# Patient Record
Sex: Female | Born: 1935 | Race: White | Hispanic: No
Health system: Southern US, Community
[De-identification: ages and names within clinical notes are randomized; demographics above are authoritative.]

## PROBLEM LIST (undated history)

## (undated) DIAGNOSIS — R609 Edema, unspecified: Secondary | ICD-10-CM

## (undated) DIAGNOSIS — I499 Cardiac arrhythmia, unspecified: Secondary | ICD-10-CM

## (undated) DIAGNOSIS — N189 Chronic kidney disease, unspecified: Secondary | ICD-10-CM

## (undated) DIAGNOSIS — M1990 Unspecified osteoarthritis, unspecified site: Secondary | ICD-10-CM

## (undated) DIAGNOSIS — D649 Anemia, unspecified: Secondary | ICD-10-CM

## (undated) DIAGNOSIS — C801 Malignant (primary) neoplasm, unspecified: Secondary | ICD-10-CM

## (undated) DIAGNOSIS — G20 Parkinson's disease: Secondary | ICD-10-CM

## (undated) DIAGNOSIS — F0390 Unspecified dementia without behavioral disturbance: Secondary | ICD-10-CM

## (undated) DIAGNOSIS — H9190 Unspecified hearing loss, unspecified ear: Secondary | ICD-10-CM

## (undated) DIAGNOSIS — K219 Gastro-esophageal reflux disease without esophagitis: Secondary | ICD-10-CM

## (undated) DIAGNOSIS — R002 Palpitations: Secondary | ICD-10-CM

## (undated) DIAGNOSIS — I471 Supraventricular tachycardia: Secondary | ICD-10-CM

## (undated) DIAGNOSIS — E039 Hypothyroidism, unspecified: Secondary | ICD-10-CM

## (undated) HISTORY — PX: OTHER SURGICAL HISTORY: SHX169

## (undated) HISTORY — DX: Supraventricular tachycardia: I47.1

## (undated) HISTORY — PX: JOINT REPLACEMENT: SHX530

## (undated) HISTORY — DX: Palpitations: R00.2

## (undated) HISTORY — DX: Edema, unspecified: R60.9

## (undated) HISTORY — PX: ABDOMINAL HYSTERECTOMY: SHX81

## (undated) HISTORY — PX: CARDIAC ELECTROPHYSIOLOGY STUDY AND ABLATION: SHX1294

---

## 2004-02-13 ENCOUNTER — Other Ambulatory Visit: Payer: Self-pay

## 2005-01-11 ENCOUNTER — Ambulatory Visit: Payer: Self-pay | Admitting: Internal Medicine

## 2005-01-29 ENCOUNTER — Ambulatory Visit: Payer: Self-pay | Admitting: Unknown Physician Specialty

## 2005-10-14 ENCOUNTER — Ambulatory Visit: Payer: Self-pay | Admitting: Gastroenterology

## 2005-12-06 ENCOUNTER — Ambulatory Visit: Payer: Self-pay | Admitting: Gastroenterology

## 2005-12-19 ENCOUNTER — Ambulatory Visit: Payer: Self-pay | Admitting: Gastroenterology

## 2006-01-30 ENCOUNTER — Ambulatory Visit: Payer: Self-pay | Admitting: Unknown Physician Specialty

## 2006-02-05 ENCOUNTER — Ambulatory Visit: Payer: Self-pay | Admitting: Internal Medicine

## 2007-02-10 ENCOUNTER — Ambulatory Visit: Payer: Self-pay | Admitting: Unknown Physician Specialty

## 2007-03-11 ENCOUNTER — Other Ambulatory Visit: Payer: Self-pay

## 2007-03-11 ENCOUNTER — Emergency Department: Payer: Self-pay | Admitting: Unknown Physician Specialty

## 2007-04-08 ENCOUNTER — Ambulatory Visit: Payer: Self-pay | Admitting: Orthopaedic Surgery

## 2007-05-25 ENCOUNTER — Ambulatory Visit: Payer: Self-pay | Admitting: Internal Medicine

## 2007-06-12 ENCOUNTER — Ambulatory Visit: Payer: Self-pay | Admitting: Internal Medicine

## 2007-06-12 ENCOUNTER — Ambulatory Visit (HOSPITAL_COMMUNITY): Admission: RE | Admit: 2007-06-12 | Discharge: 2007-06-13 | Payer: Self-pay | Admitting: Internal Medicine

## 2007-07-06 ENCOUNTER — Ambulatory Visit: Payer: Self-pay | Admitting: Internal Medicine

## 2007-12-27 ENCOUNTER — Ambulatory Visit: Payer: Self-pay | Admitting: Internal Medicine

## 2008-01-15 ENCOUNTER — Ambulatory Visit: Payer: Self-pay | Admitting: Internal Medicine

## 2008-01-24 ENCOUNTER — Ambulatory Visit: Payer: Self-pay | Admitting: Internal Medicine

## 2008-09-15 ENCOUNTER — Ambulatory Visit: Payer: Self-pay | Admitting: Unknown Physician Specialty

## 2009-05-02 DIAGNOSIS — I471 Supraventricular tachycardia, unspecified: Secondary | ICD-10-CM | POA: Insufficient documentation

## 2009-05-02 DIAGNOSIS — R002 Palpitations: Secondary | ICD-10-CM

## 2009-05-02 DIAGNOSIS — R609 Edema, unspecified: Secondary | ICD-10-CM

## 2009-09-19 ENCOUNTER — Ambulatory Visit: Payer: Self-pay | Admitting: Unknown Physician Specialty

## 2010-09-21 ENCOUNTER — Ambulatory Visit: Payer: Self-pay | Admitting: Unknown Physician Specialty

## 2011-04-09 NOTE — Op Note (Signed)
NAMELEONORA, GORES                 ACCOUNT NO.:  [id number]   MEDICAL RECORD NO.:  [id number]          PATIENT TYPE:  OIB   LOCATION:  4731                         FACILITY:  [facility]   PHYSICIAN:  Duke Salvia, MD, FACCDATE OF BIRTH:  [birth year]   DATE OF PROCEDURE:  06/12/2007  DATE OF DISCHARGE:  06/13/2007                               OPERATIVE REPORT   PREOPERATIVE DIAGNOSIS:  Supraventricular tachycardia.   POSTOPERATIVE DIAGNOSIS:  AV nodal re-entry tachycardia.   PROCEDURE:  Invasive electrophysiological study, arrhythmia mapping, and  radiofrequency catheter ablation.   Following obtaining informed consent, the patient was brought to the  electrophysiology laboratory and placed on the fluoroscopic table in the  supine position.  After routine prep and drape, cardiac catheterization  was performed and local anesthesia with conscious sedation.  Noninvasive  blood pressure monitoring and transcutaneous oxygen saturation  monitoring were performed continuously throughout the procedure.  Following the procedure, the catheters were removed, hemostasis was  obtained, and the patient was transferred to the holding area in stable  condition.   CATHETERS:  5-French quadripolar catheter was inserted via the left  femoral vein to the right ventricular apex.  5-French quadripolar catheter was inserted via the left femoral vein to  the AV junction.  6-French octapolar catheter was inserted via the right femoral vein to  the coronary sinus.  7-French 4-mm deflectable ablation tip catheter was inserted via a  Swartz SL2 sheath via the right femoral vein to mapping sites in the  posterior septal space.   SURFACES:  1, aVF and V1 were monitored continuously throughout the  procedure.  Following insertion of the catheters, the stimulation  protocol included incremental atrial pacing.  Incremental ventricular pacing.  Single atrial extra stimuli at paced cycle length of 600 and 450  milliseconds.   RESULTS:  Surface electrocardiogram:   Initial:  Rhythm:  Sinus; RR interval 881 milliseconds; PR interval 206  milliseconds; QRS duration 360 milliseconds (recorded but probably too  short) QT interval:  403 milliseconds; P-wave duration 112 milliseconds;  bundle branch block:  Absent; pre-excitation:  Absent.  AH interval is 112 milliseconds; HV interval 49 milliseconds.   Final:  Rhythm:  Sinus; RR interval 707 milliseconds; PR interval:  170  milliseconds; QRS duration 83 milliseconds; QT interval 346  milliseconds; pre-excitation:  Absent; bundle branch block:  Absent.  AH interval:  90 milliseconds; HV interval 46 milliseconds.   AV nodal function:  AV Wenckebach at 40 milliseconds.  VA Wenckebach at 360 milliseconds.  AV nodal effective refractory period with pathway the AV node 600  milliseconds was 480 milliseconds.  AV nodal conduction was discontinuous echo beats pre ablation and was  continuous post ablation.  Retrograde conduction was continuous pre and post ablation.   ACCESSORY PATHWAY FUNCTION:  No evidence of na accessory pathway was  identified.   ARRHYTHMIAS INDUCED.:  AV nodal reentry tachycardia was reproducibly  induced with double atrial extra stimuli at 450:  370:  290.  Tachycardia cycle length was approximately 495 milliseconds.   In a typical episode of tachycardia, the AH interval  was 337  milliseconds, the VA time was 44 milliseconds, and the HA time was 56  milliseconds.   The tachycardia was AH prolongation dependent, atrial activation was in  His electrogram.   RADIOFREQUENCY ENERGY:  A total of 39 seconds of radio wave energy was  applied at sites of presumed slow pathway potentials.  Junctional  tachycardia ensued and thereafter no evidence of AV nodal slow pathway  antegrade conduction was seen.   FLUOROSCOPY TIME:  A total of 4 minutes and 48 seconds of fluoroscopy  time was utilized at 7.55 frames per second.    IMPRESSION:  1. Normal sinus function.  2. Normal atrial function.  3. Abnormal AV node function manifested by inducible slow-fast typical      AV nodal reentry tachycardia.  Slow pathway modification using      radiofrequency energy successfully eliminated the substrate from      the patient's arrhythmia mechanism.  4. Normal His-Purkinje system function.  5. No accessory pathway.  6. Normal ventricular response to programmed stimulation.   SUMMARY OF CONCLUSION:  The results of electrophysiological testing  confirmed AV nodal reentry tachycardia mechanism underlying the  patient's clinical arrhythmia.  Slow pathway modification successfully  modified the substrate and rendered the patient non-inducible.   I plan to watch the patient overnight, endocarditis and aspirin  prophylaxis for 6-12 weeks.      Duke Salvia, MD, [facility]  Electronically Signed     SCK/MEDQ  D:  07/28/2007  T:  07/28/2007  Job:  [id number]

## 2011-04-09 NOTE — Discharge Summary (Signed)
NAME:  RIELYNN, TRULSON                 ACCOUNT NO.:  [id number]   MEDICAL RECORD NO.:  [id number]          PATIENT TYPE:  OIB   LOCATION:  4731                         FACILITY:  [facility]   PHYSICIAN:  Duke Salvia, MD, FACCDATE OF BIRTH:  1924-06-01   DATE OF ADMISSION:  06/12/2007  DATE OF DISCHARGE:                               DISCHARGE SUMMARY   This patient has no known drug allergies.   Dictation and examination greater than 35 minutes.   FINAL DIAGNOSES:  1. Recurrent supraventricular tachycardia with syncope, presyncope and      marked palpitation.      a.     Electrophysiology study July 18, radiofrequency catheter       ablation with slow P-wave modification.   SECONDARY DIAGNOSES:  1. Treated hypothyroidism.  2. Probable diastolic congestive heart failure/dyspnea on exertion.  3. Gastroesophageal reflux disease.  4. Hypertension.  5. Status post total knee arthroplasty.   PROCEDURE:  June 12, 2007, electrophysiology study, radiofrequency  catheter ablation of supraventricular tachycardia with slow P-wave  modification, Dr. Sherryl Manges.   BRIEF HISTORY:  Ms. Wonders is an 77 year old female.  She has a history  of recurrent palpitations.  These correlate with SVT.  She often gets  dizzy when she is experiencing that rapid heart rates.  Sometimes they  last all day, sometimes only 15 minutes, but in the last 3 months she  has had to go to the hospital three times in order to stop them.  She  has a beta blocker and she takes rescue dose of beta blocker, and  sometimes this does not help.  Her past medical cardiac workup includes  an echocardiogram, a normal Myoview, remote catheterizations, normal.  Dr. Graciela Husbands prior to doing this procedure recommended a Myoview study with  Dr. Lady Gary.  This study is not available.  Dr. Graciela Husbands discussed the many  options to treating SVT including medical therapy, which consists mainly  of AV node modification drugs or antiarrhythmic drugs  or ablation.  The  patient opts for ablation and will present electively.   HOSPITAL COURSE:  The patient presented July 18.  She underwent  electrophysiology study with radiofrequency catheter ablation,  successful slow P wave modification, Dr. Graciela Husbands.  The patient has had no  postprocedural complications and discharges after overnight observation  on telemetry.  The patient has maintained sinus rhythm, has no  recurrence of tachyarrhythmias.   Lab work pertinent to this admission was drawn July 14.  The glucose was  103, BUN is 47, creatinine 1.55, the sodium was 142, potassium 4.2,  chloride 105, and carbonate 24.  White cells 6.1 hemoglobin 11.5,  hematocrit 33.5, platelets of 184.  Pro time is 10.7, INR is 1.0.  PTT  is 29.      Maple Mirza, [place]      Duke Salvia, MD, [facility]  Electronically Signed    GM/MEDQ  D:  06/12/2007  T:  06/13/2007  Job:  [id number]   cc:   Jenkins Rouge, MD  Dr. Lady Gary

## 2011-04-09 NOTE — Discharge Summary (Signed)
Dana Hudson, Dana Hudson                 ACCOUNT NO.:  [id number]   MEDICAL RECORD NO.:  [id number]          PATIENT TYPE:  OIB   LOCATION:  4731                         FACILITY:  [facility]   PHYSICIAN:  Joellyn Rued, PA-C     DATE OF BIRTH:  1924-06-13   DATE OF ADMISSION:  06/12/2007  DATE OF DISCHARGE:                         DISCHARGE SUMMARY - REFERRING   NO DICTATION      Joellyn Rued, PA-C     EW/MEDQ  D:  06/13/2007  T:  06/13/2007  Job:  [id number]

## 2011-04-09 NOTE — Discharge Summary (Signed)
NAME:  Dana Hudson, Dana Hudson                 ACCOUNT NO.:  [id number]   MEDICAL RECORD NO.:  [id number]          PATIENT TYPE:  OIB   LOCATION:  4731                         FACILITY:  [facility]   PHYSICIAN:  Joellyn Rued, PA-C     DATE OF BIRTH:  May 09, 1924   DATE OF ADMISSION:  06/12/2007  DATE OF DISCHARGE:                         DISCHARGE SUMMARY - REFERRING   ADDENDUM:  Prior to discharge, Dr. Jens Som did a quick look  echocardiogram, that according to Dr. Ludwig Clarks verbal report was  unremarkable.  He also ordered a PA and lateral chest x-ray which showed  a question nodule.  This is repeated in a bleak position.  Did not show  any further nodule, thus, she was discharged home.      Joellyn Rued, PA-C     EW/MEDQ  D:  06/13/2007  T:  06/13/2007  Job:  [id number]

## 2011-04-09 NOTE — Letter (Signed)
July 06, 2007    Cheryl L. Lin Givens, M.D.  [address] [place], [place] [address]   RE:  MORENE, CECILIO  MRN:  [id number]  /  DOB:  1924-11-18   Dear Elnita Maxwell:   Ms. Faiella comes in following her catheter ablation for AV nodal re-  entry tachycardia.  She has had no recurrent tachy palpitations.  She  does have thumps which are likely representative of PVCs or PACs.  She  is otherwise doing pretty well.  She has had significant lower extremity  edema, which has been progressive since her procedure at which time her  Lasix had been discontinued.   CURRENT MEDICATIONS:  1. Diovan 80/12.5.  2. Levothyroxine 88.  3. Toprol 25.  4. Omeprazole.   PHYSICAL EXAMINATION:  VITAL SIGNS:  Her blood pressure is 118/72.  Her  pulse is 66.  Her weight was 201, which is down 5 pounds from the end of  June.  LUNGS:  Clear.  HEART:  Sounds were regular.  EXTREMITIES:  Had significant bilateral peripheral edema.   Electrocardiogram was not obtained today but her pulse was normal.   IMPRESSION:  1. Supraventricular tachycardia, status post slow pathway      modification.  2. Palpitations consistent with premature ventricular      contractions/premature atrial contractions.  3. Peripheral edema.   I have suggested that she resume her Lasix taking 20 mg every other day  until she sees you which is a month from now.  She will continue on her  Diovan HCT.   Thanks very much for allowing me to participate in her care.    Sincerely,      Duke Salvia, MD, [facility]  Electronically Signed    SCK/MedQ  DD: 07/06/2007  DT: 07/07/2007  Job #: [id number]

## 2011-04-09 NOTE — Letter (Signed)
May 25, 2007    Dana Hudson, M.D.  [address]  [place] [address]   RE:  Dana, Hudson  MRN:  [id number]  /  DOB:  [birth year]   Dear Dana Hudson,   It was a pleasure to see Dana Hudson again at your request because of  recurrent supraventricular Hudson.   As you remember, I saw her about two or three years ago for you and Dr.  Harold Hedge because of recurrent symptoms.  These have been going on  for 20 years or so.  They were abrupt in onset and offset, and on one  occasion they have been associated with syncope.  They are frequently  associated with presyncope.  They are FROG positive.  They are diuretic  negative.  She has had to go to the hospital three times in the last  three months for these.   She has also had problems with progressive shortness of breath over the  last six months.  When I had seen her in February 2006, data prior to  that included a normal echocardiogram and a normal Myoview and a remote  catheterization that was also normal.  She does have a history of  hypertension.  She has some peripheral edema.   PAST MEDICAL HISTORY:  1. GE reflux disease.  2. Treated hypothyroidism.  3. A propensity toward bradycardia.   REVIEW OF SYSTEMS:  In addition to the above, is notable for arthritis.   PAST SURGICAL HISTORY:  Knee replacement surgery.   SOCIAL HISTORY:  She is retired.  She is widowed.  She has four children  and four grandchildren.  She comes in today with one of her  granddaughters.  She has a number of great-grandchildren.   She does not use cigarettes, alcohol, or recreational drugs.   MEDICATIONS TODAY:  1. Levothyroxine 88 mcg.  2. Toprol 50.  3. Diovan 80/12.5.  4. Prilosec 40.   She has no known drug allergies.   PHYSICAL EXAMINATION:  VITAL SIGNS:  Her blood pressure is 118/72, her  pulse is 66.  NECK:  Her neck veins were 7 or 8.  Her carotids were brisk  BACK: Without kyphosis or scoliosis.  LUNGS:   She did have some basilar crackles.  HEART SOUNDS:  Regular, without murmurs or gallops.  ABDOMEN:  Soft, with active bowel sounds.  EXTREMITIES:  Femoral pulses were 2+.  Distal pulses were intact.  There  is 1+ edema on the right, 1-2+ edema on the left, with moderate venous  stasis changes on the left.  NEUROLOGIC:  Grossly normal.   Electrocardiogram from the hospital the other day demonstrated a narrow  QRS Hudson at a cycle length of 500 msec.  There was an R prime  inscribed on the very proximal portion of the ST segment in lead V1.   IMPRESSION:  1. Recurrent supraventricular Hudson, probable atrioventricular      nodal reentry.  2. Hypertension.  3. Dyspnea on exertion, probably diastolic heart failure, probably      chronic.  4. Gastroesophageal reflux disease.   Dana Hudson, Dana Hudson.  She and  her granddaughter and I discussed at length the treatment options,  including augmented beta blocker and/or calcium blocker therapy,  realizing that the bradycardia might be a problem, as well as catheter  ablation with EP testing.  We also discussed antiarrhythmic drugs and  the potential for proarrhythmia.  After reviewing the above and trying  to  understand that her symptoms are really quite limiting in terms of a)  frequency, b) severity, and c) the associated fear factor, she has  decided to proceed with catheter ablation.  We have reviewed the  potential benefits as well as the potential risks, including but not  limited to death, perforation, and heart block requiring pacemaker  implantation.   I am also concerned, however, about the dyspnea.  I suspect that is  chronic diastolic failure.  I have given her a prescription today for  Lasix 40 every other day to take in addition to her Diovan/HCT.  I have  also arranged for her to have a Myoview scan with Dr. Lady Gary to make sure  there is no intercurrent deterioration in her  systolic function or  identified ischemia which may be contributing to her dyspnea.   Dana Hudson, thanks very much for asking [place] to see her.    Sincerely,      Duke Salvia, MD, [facility]  Electronically Signed    SCK/MedQ  DD: 05/25/2007  DT: 05/25/2007  Job #: [id number]   CC:    Harold Hedge, M.D.

## 2011-05-16 ENCOUNTER — Encounter: Payer: Self-pay | Admitting: Cardiovascular Disease

## 2012-08-04 ENCOUNTER — Ambulatory Visit: Payer: Self-pay

## 2017-10-09 ENCOUNTER — Encounter: Payer: Self-pay | Admitting: *Deleted

## 2017-10-14 ENCOUNTER — Encounter: Payer: Self-pay | Admitting: *Deleted

## 2017-10-14 ENCOUNTER — Encounter
Admission: RE | Disposition: A | Discharge status: Home / Self Care | Payer: Self-pay | Source: Ambulatory Visit | Attending: Ophthalmology

## 2017-10-14 ENCOUNTER — Ambulatory Visit: Payer: Medicare (Managed Care) | Admitting: Anesthesiology

## 2017-10-14 ENCOUNTER — Ambulatory Visit
Admission: RE | Admit: 2017-10-14 | Discharge: 2017-10-14 | Disposition: A | Discharge status: Home / Self Care | Payer: Medicare (Managed Care) | Source: Ambulatory Visit | Attending: Ophthalmology | Admitting: Ophthalmology

## 2017-10-14 ENCOUNTER — Other Ambulatory Visit: Payer: Self-pay

## 2017-10-14 DIAGNOSIS — F0390 Unspecified dementia without behavioral disturbance: Secondary | ICD-10-CM | POA: Diagnosis not present

## 2017-10-14 DIAGNOSIS — Z90710 Acquired absence of both cervix and uterus: Secondary | ICD-10-CM | POA: Insufficient documentation

## 2017-10-14 DIAGNOSIS — E039 Hypothyroidism, unspecified: Secondary | ICD-10-CM | POA: Insufficient documentation

## 2017-10-14 DIAGNOSIS — Z79899 Other long term (current) drug therapy: Secondary | ICD-10-CM | POA: Insufficient documentation

## 2017-10-14 DIAGNOSIS — N183 Chronic kidney disease, stage 3 (moderate): Secondary | ICD-10-CM | POA: Insufficient documentation

## 2017-10-14 DIAGNOSIS — M1990 Unspecified osteoarthritis, unspecified site: Secondary | ICD-10-CM | POA: Diagnosis not present

## 2017-10-14 DIAGNOSIS — G20 Parkinson's disease: Secondary | ICD-10-CM | POA: Insufficient documentation

## 2017-10-14 DIAGNOSIS — K219 Gastro-esophageal reflux disease without esophagitis: Secondary | ICD-10-CM | POA: Insufficient documentation

## 2017-10-14 DIAGNOSIS — Z85828 Personal history of other malignant neoplasm of skin: Secondary | ICD-10-CM | POA: Diagnosis not present

## 2017-10-14 DIAGNOSIS — H2511 Age-related nuclear cataract, right eye: Secondary | ICD-10-CM | POA: Insufficient documentation

## 2017-10-14 HISTORY — DX: Malignant (primary) neoplasm, unspecified: C80.1

## 2017-10-14 HISTORY — DX: Cardiac arrhythmia, unspecified: I49.9

## 2017-10-14 HISTORY — DX: Chronic kidney disease, unspecified: N18.9

## 2017-10-14 HISTORY — DX: Parkinson's disease: G20

## 2017-10-14 HISTORY — DX: Gastro-esophageal reflux disease without esophagitis: K21.9

## 2017-10-14 HISTORY — DX: Unspecified dementia, unspecified severity, without behavioral disturbance, psychotic disturbance, mood disturbance, and anxiety: F03.90

## 2017-10-14 HISTORY — DX: Hypothyroidism, unspecified: E03.9

## 2017-10-14 HISTORY — DX: Anemia, unspecified: D64.9

## 2017-10-14 HISTORY — PX: CATARACT EXTRACTION W/PHACO: SHX586

## 2017-10-14 SURGERY — PHACOEMULSIFICATION, CATARACT, WITH IOL INSERTION
Anesthesia: Monitor Anesthesia Care | Site: Eye | Laterality: Right | Wound class: Clean

## 2017-10-14 MED ORDER — LABETALOL HCL 5 MG/ML IV SOLN
INTRAVENOUS | Status: AC
  Filled 2017-10-14: qty 4

## 2017-10-14 MED ORDER — POVIDONE-IODINE 5 % OP SOLN
OPHTHALMIC | Status: DC | PRN
  Administered 2017-10-14: 1 via OPHTHALMIC

## 2017-10-14 MED ORDER — MIDAZOLAM HCL 2 MG/2ML IJ SOLN
INTRAMUSCULAR | Status: AC
  Filled 2017-10-14: qty 2

## 2017-10-14 MED ORDER — MIDAZOLAM HCL 2 MG/2ML IJ SOLN
INTRAMUSCULAR | Status: DC | PRN
  Administered 2017-10-14: .5 mg via INTRAVENOUS

## 2017-10-14 MED ORDER — MOXIFLOXACIN HCL 0.5 % OP SOLN: 1.0000 [drp] | OPHTHALMIC | Status: DC | PRN

## 2017-10-14 MED ORDER — GLYCOPYRROLATE 0.2 MG/ML IJ SOLN
INTRAMUSCULAR | Status: AC
  Filled 2017-10-14: qty 1

## 2017-10-14 MED ORDER — CARBACHOL 0.01 % IO SOLN
INTRAOCULAR | Status: DC | PRN
  Administered 2017-10-14: .5 mL via INTRAOCULAR

## 2017-10-14 MED ORDER — ARMC OPHTHALMIC DILATING DROPS
1.0000 "application " | OPHTHALMIC | Status: DC
  Administered 2017-10-14 (×3): 1 via OPHTHALMIC

## 2017-10-14 MED ORDER — MOXIFLOXACIN HCL 0.5 % OP SOLN
OPHTHALMIC | Status: AC
  Filled 2017-10-14: qty 3

## 2017-10-14 MED ORDER — SODIUM CHLORIDE 0.9 % IV SOLN
INTRAVENOUS | Status: DC
  Administered 2017-10-14: 10:00:00 via INTRAVENOUS

## 2017-10-14 MED ORDER — EPINEPHRINE PF 1 MG/ML IJ SOLN
INTRAMUSCULAR | Status: DC | PRN
  Administered 2017-10-14: 1 mL via OPHTHALMIC

## 2017-10-14 MED ORDER — NA CHONDROIT SULF-NA HYALURON 40-17 MG/ML IO SOLN
INTRAOCULAR | Status: DC | PRN
  Administered 2017-10-14: 1 mL via INTRAOCULAR

## 2017-10-14 MED ORDER — MOXIFLOXACIN HCL 0.5 % OP SOLN
OPHTHALMIC | Status: DC | PRN
Start: 2017-10-14 — End: 2017-10-14
  Administered 2017-10-14: .2 mL via OPHTHALMIC

## 2017-10-14 MED ORDER — ARMC OPHTHALMIC DILATING DROPS
OPHTHALMIC | Status: AC
  Filled 2017-10-14: qty 0.4

## 2017-10-14 MED ORDER — GLYCOPYRROLATE 0.2 MG/ML IJ SOLN
INTRAMUSCULAR | Status: DC | PRN
  Administered 2017-10-14: 0.2 mg via INTRAVENOUS

## 2017-10-14 MED ORDER — LIDOCAINE HCL (PF) 4 % IJ SOLN
INTRAMUSCULAR | Status: DC | PRN
  Administered 2017-10-14: 2 mL via OPHTHALMIC

## 2017-10-14 SURGICAL SUPPLY — 16 items
GLOVE BIO SURGEON STRL SZ8 (GLOVE) ×3 IMPLANT
GLOVE BIOGEL M 6.5 STRL (GLOVE) ×3 IMPLANT
GLOVE SURG LX 8.0 MICRO (GLOVE) ×2
GLOVE SURG LX STRL 8.0 MICRO (GLOVE) ×1 IMPLANT
GOWN STRL REUS W/ TWL LRG LVL3 (GOWN DISPOSABLE) ×2 IMPLANT
GOWN STRL REUS W/TWL LRG LVL3 (GOWN DISPOSABLE) ×6
LABEL CATARACT MEDS ST (LABEL) ×3 IMPLANT
LENS IOL TECNIS ITEC 22.5 (Intraocular Lens) ×2 IMPLANT
PACK CATARACT (MISCELLANEOUS) ×3 IMPLANT
PACK CATARACT BRASINGTON LX (MISCELLANEOUS) ×3 IMPLANT
PACK EYE AFTER SURG (MISCELLANEOUS) ×3 IMPLANT
SOL BSS BAG (MISCELLANEOUS) ×3
SOLUTION BSS BAG (MISCELLANEOUS) ×1 IMPLANT
SYR 5ML LL (SYRINGE) ×3 IMPLANT
WATER STERILE IRR 250ML POUR (IV SOLUTION) ×3 IMPLANT
WIPE NON LINTING 3.25X3.25 (MISCELLANEOUS) ×3 IMPLANT

## 2017-10-14 NOTE — Op Note (Signed)
PREOPERATIVE DIAGNOSIS:  Nuclear sclerotic cataract of the right eye.   POSTOPERATIVE DIAGNOSIS:  nuclear sclerotic cataract right eye   OPERATIVE PROCEDURE: Procedure(s): CATARACT EXTRACTION PHACO AND INTRAOCULAR LENS PLACEMENT (IOC)   SURGEON:  Birder Robson, MD.   ANESTHESIA:  Anesthesiologist: Emmie Niemann, MD CRNA: Jonna Clark, CRNA  1.      Managed anesthesia care. 2.      0.35ml of Shugarcaine was instilled in the eye following the paracentesis.   COMPLICATIONS:  None.   TECHNIQUE:   Stop and chop   DESCRIPTION OF PROCEDURE:  The patient was examined and consented in the preoperative holding area where the aforementioned topical anesthesia was applied to the right eye and then brought back to the Operating Room where the right eye was prepped and draped in the usual sterile ophthalmic fashion and a lid speculum was placed. A paracentesis was created with the side port blade and the anterior chamber was filled with viscoelastic. A near clear corneal incision was performed with the steel keratome. A continuous curvilinear capsulorrhexis was performed with a cystotome followed by the capsulorrhexis forceps. Hydrodissection and hydrodelineation were carried out with BSS on a blunt cannula. The lens was removed in a stop and chop  technique and the remaining cortical material was removed with the irrigation-aspiration handpiece. The capsular bag was inflated with viscoelastic and the Technis [id number]  lens was placed in the capsular bag without complication. The remaining viscoelastic was removed from the eye with the irrigation-aspiration handpiece. The wounds were hydrated. The anterior chamber was flushed with Miostat and the eye was inflated to physiologic pressure. 0.36ml of Vigamox was placed in the anterior chamber. The wounds were found to be water tight. The eye was dressed with Vigamox. The patient was given protective glasses to wear throughout the day and a shield with which  to sleep tonight. The patient was also given drops with which to begin a drop regimen today and will follow-up with me in one day. Implant Name Type Inv. Item Serial No. Manufacturer Lot No. LRB No. Used  LENS IOL DIOP 22.5 - [id number] Intraocular Lens LENS IOL DIOP 22.5 [id number] AMO  Right 1   Procedure(s) with comments: CATARACT EXTRACTION PHACO AND INTRAOCULAR LENS PLACEMENT (IOC) (Right) - [place] 01:04 AP% 16.9 CDE 10.96 Fluid pack lot # [id number]  Electronically signed: [place] 10/14/2017 11:35 AM

## 2017-10-14 NOTE — Anesthesia Postprocedure Evaluation (Signed)
Anesthesia Post Note  Patient: Dana Hudson  Procedure(s) Performed: CATARACT EXTRACTION PHACO AND INTRAOCULAR LENS PLACEMENT ([place]) (Right Eye)  Patient location during evaluation: Short Stay Anesthesia Type: MAC Level of consciousness: awake and alert and oriented Pain management: pain level controlled Vital Signs Assessment: post-procedure vital signs reviewed and stable Respiratory status: spontaneous breathing Cardiovascular status: stable Postop Assessment: no headache, no apparent nausea or vomiting and adequate PO intake Anesthetic complications: no     Last Vitals:  Vitals:   10/14/17 1138 10/14/17 1152  BP: (!) 152/70 (!) 163/63  Pulse: 62 75  Resp: 16   Temp:    SpO2: 97% 97%    Last Pain:  Vitals:   10/14/17 1007  TempSrc: Temporal                 Lanora Manis

## 2017-10-14 NOTE — H&P (Signed)
All labs reviewed. Abnormal studies sent to patients PCP when indicated.  Previous H&P reviewed, patient examined, there are NO CHANGES.  Dana Hudson LOUIS11/20/201811:08 AM

## 2017-10-14 NOTE — Discharge Instructions (Signed)
Eye Surgery Discharge Instructions  Expect mild scratchy sensation or mild soreness. DO NOT RUB YOUR EYE!  The day of surgery:  Minimal physical activity, but bed rest is not required  No reading, computer work, or close hand work  No bending, lifting, or straining.  May watch TV  For 24 hours:  No driving, legal decisions, or alcoholic beverages  Safety precautions  Eat anything you prefer: It is better to start with liquids, then soup then solid foods.  _____ Eye patch should be worn until postoperative exam tomorrow.  ____ Solar shield eyeglasses should be worn for comfort in the sunlight/patch while sleeping  Resume all regular medications including aspirin or Coumadin if these were discontinued prior to surgery. You may shower, bathe, shave, or wash your hair. Tylenol may be taken for mild discomfort.  Call your doctor if you experience significant pain, nausea, or vomiting, fever > 101 or other signs of infection. [phone] or [phone] Specific instructions:  Follow-up Information    Birder Robson, MD Follow up.   Specialty:  Ophthalmology Why:  November 21 at 9:05am Contact information: [address] [place] [address] [phone]

## 2017-10-14 NOTE — Anesthesia Post-op Follow-up Note (Signed)
Anesthesia QCDR form completed.        

## 2017-10-14 NOTE — Transfer of Care (Signed)
Immediate Anesthesia Transfer of Care Note  Patient: Dana Hudson  Procedure(s) Performed: CATARACT EXTRACTION PHACO AND INTRAOCULAR LENS PLACEMENT (IOC) (Right Eye)  Patient Location: PACU and Short Stay  Anesthesia Type:MAC  Level of Consciousness: awake, alert  and oriented  Airway & Oxygen Therapy: Patient Spontanous Breathing  Post-op Assessment: Report given to RN and Post -op Vital signs reviewed and stable  Post vital signs: Reviewed and stable  Last Vitals:  Vitals:   10/14/17 1137 10/14/17 1138  BP: 103/67   Pulse:  62  Resp:  16  Temp:    SpO2:  97%    Last Pain:  Vitals:   10/14/17 1007  TempSrc: Temporal         Complications: No apparent anesthesia complications

## 2017-10-14 NOTE — Anesthesia Preprocedure Evaluation (Signed)
Anesthesia Evaluation  Patient identified by MRN, date of birth, ID band Patient awake    Reviewed: Allergy & Precautions, NPO status , Patient's Chart, lab work & pertinent test results  History of Anesthesia Complications Negative for: history of anesthetic complications  Airway Mallampati: II  TM Distance: >3 FB Neck ROM: Full    Dental  (+) Upper Dentures, Lower Dentures   Pulmonary neg pulmonary ROS, neg sleep apnea, neg COPD,    breath sounds clear to auscultation- rhonchi (-) wheezing      Cardiovascular (-) hypertension(-) angina(-) CAD and (-) Past MI Dysrhythmias: hx of palpitations.  Rhythm:Regular Rate:Normal - Systolic murmurs and - Diastolic murmurs    Neuro/Psych Dementia     GI/Hepatic Neg liver ROS, GERD  ,  Endo/Other  neg diabetesHypothyroidism   Renal/GU Renal InsufficiencyRenal disease     Musculoskeletal negative musculoskeletal ROS (+)   Abdominal (+) - obese,   Peds  Hematology  (+) anemia ,   Anesthesia Other Findings Past Medical History: No date: Anemia No date: Cancer ([place])     Comment:  SKIN No date: Chronic kidney disease     Comment:  STAGE 3 No date: Dementia No date: Dysrhythmia No date: Edema No date: GERD (gastroesophageal reflux disease) No date: Hypothyroidism No date: Palpitations No date: Parkinson disease (HCC) No date: Paroxysmal supraventricular tachycardia (HCC)     Comment:  SVT/PSVT/PAT   Reproductive/Obstetrics                             Anesthesia Physical Anesthesia Plan  ASA: II  Anesthesia Plan: MAC   Post-op Pain Management:    Induction: Intravenous  PONV Risk Score and Plan: 2 and Midazolam  Airway Management Planned: Natural Airway  Additional Equipment:   Intra-op Plan:   Post-operative Plan:   Informed Consent: I have reviewed the patients History and Physical, chart, labs and discussed the procedure  including the risks, benefits and alternatives for the proposed anesthesia with the patient or authorized representative who has indicated his/her understanding and acceptance.     Plan Discussed with: CRNA and Anesthesiologist  Anesthesia Plan Comments:         Anesthesia Quick Evaluation

## 2017-11-06 ENCOUNTER — Encounter: Payer: Self-pay | Admitting: *Deleted

## 2017-11-11 ENCOUNTER — Ambulatory Visit: Payer: Medicare (Managed Care) | Admitting: Certified Registered Nurse Anesthetist

## 2017-11-11 ENCOUNTER — Encounter
Admission: RE | Disposition: A | Discharge status: Home / Self Care | Payer: Self-pay | Source: Ambulatory Visit | Attending: Ophthalmology

## 2017-11-11 ENCOUNTER — Ambulatory Visit
Admission: RE | Admit: 2017-11-11 | Discharge: 2017-11-11 | Disposition: A | Discharge status: Home / Self Care | Payer: Medicare (Managed Care) | Source: Ambulatory Visit | Attending: Ophthalmology | Admitting: Ophthalmology

## 2017-11-11 DIAGNOSIS — F0390 Unspecified dementia without behavioral disturbance: Secondary | ICD-10-CM | POA: Diagnosis not present

## 2017-11-11 DIAGNOSIS — H2512 Age-related nuclear cataract, left eye: Secondary | ICD-10-CM | POA: Insufficient documentation

## 2017-11-11 DIAGNOSIS — Z85828 Personal history of other malignant neoplasm of skin: Secondary | ICD-10-CM | POA: Diagnosis not present

## 2017-11-11 DIAGNOSIS — G20 Parkinson's disease: Secondary | ICD-10-CM | POA: Diagnosis not present

## 2017-11-11 DIAGNOSIS — Z79899 Other long term (current) drug therapy: Secondary | ICD-10-CM | POA: Diagnosis not present

## 2017-11-11 DIAGNOSIS — E039 Hypothyroidism, unspecified: Secondary | ICD-10-CM | POA: Insufficient documentation

## 2017-11-11 DIAGNOSIS — I10 Essential (primary) hypertension: Secondary | ICD-10-CM | POA: Diagnosis not present

## 2017-11-11 DIAGNOSIS — K219 Gastro-esophageal reflux disease without esophagitis: Secondary | ICD-10-CM | POA: Diagnosis not present

## 2017-11-11 HISTORY — DX: Unspecified osteoarthritis, unspecified site: M19.90

## 2017-11-11 HISTORY — DX: Unspecified hearing loss, unspecified ear: H91.90

## 2017-11-11 HISTORY — PX: CATARACT EXTRACTION W/PHACO: SHX586

## 2017-11-11 SURGERY — PHACOEMULSIFICATION, CATARACT, WITH IOL INSERTION
Anesthesia: Monitor Anesthesia Care | Site: Eye | Laterality: Left | Wound class: Clean

## 2017-11-11 MED ORDER — POVIDONE-IODINE 5 % OP SOLN
OPHTHALMIC | Status: AC
  Filled 2017-11-11: qty 30

## 2017-11-11 MED ORDER — FENTANYL CITRATE (PF) 100 MCG/2ML IJ SOLN
INTRAMUSCULAR | Status: AC
  Filled 2017-11-11: qty 2

## 2017-11-11 MED ORDER — SODIUM CHLORIDE 0.9 % IV SOLN
INTRAVENOUS | Status: DC
  Administered 2017-11-11: 09:00:00 via INTRAVENOUS

## 2017-11-11 MED ORDER — MOXIFLOXACIN HCL 0.5 % OP SOLN: 1.0000 [drp] | OPHTHALMIC | Status: DC | PRN

## 2017-11-11 MED ORDER — CARBACHOL 0.01 % IO SOLN
INTRAOCULAR | Status: DC | PRN
  Administered 2017-11-11: .5 mL via INTRAOCULAR

## 2017-11-11 MED ORDER — FENTANYL CITRATE (PF) 100 MCG/2ML IJ SOLN
INTRAMUSCULAR | Status: DC | PRN
  Administered 2017-11-11: 50 ug via INTRAVENOUS

## 2017-11-11 MED ORDER — POVIDONE-IODINE 5 % OP SOLN
OPHTHALMIC | Status: DC | PRN
  Administered 2017-11-11: 1 via OPHTHALMIC

## 2017-11-11 MED ORDER — LIDOCAINE HCL (PF) 4 % IJ SOLN
INTRAMUSCULAR | Status: AC
Start: 2017-11-11 — End: 2017-11-11
  Filled 2017-11-11: qty 5

## 2017-11-11 MED ORDER — GLYCOPYRROLATE 0.2 MG/ML IJ SOLN
INTRAMUSCULAR | Status: AC
  Filled 2017-11-11: qty 1

## 2017-11-11 MED ORDER — MOXIFLOXACIN HCL 0.5 % OP SOLN
OPHTHALMIC | Status: AC
  Filled 2017-11-11: qty 3

## 2017-11-11 MED ORDER — NA CHONDROIT SULF-NA HYALURON 40-17 MG/ML IO SOLN
INTRAOCULAR | Status: AC
Start: 2017-11-11 — End: 2017-11-11
  Filled 2017-11-11: qty 1

## 2017-11-11 MED ORDER — MOXIFLOXACIN HCL 0.5 % OP SOLN
OPHTHALMIC | Status: DC | PRN
  Administered 2017-11-11: .2 mL via OPHTHALMIC

## 2017-11-11 MED ORDER — GLYCOPYRROLATE 0.2 MG/ML IJ SOLN
INTRAMUSCULAR | Status: DC | PRN
  Administered 2017-11-11: .15 mg via INTRAVENOUS

## 2017-11-11 MED ORDER — ARMC OPHTHALMIC DILATING DROPS
OPHTHALMIC | Status: AC
  Filled 2017-11-11: qty 0.4

## 2017-11-11 MED ORDER — EPINEPHRINE PF 1 MG/ML IJ SOLN
INTRAMUSCULAR | Status: DC | PRN
  Administered 2017-11-11: 1 mL via OPHTHALMIC

## 2017-11-11 MED ORDER — NA CHONDROIT SULF-NA HYALURON 40-17 MG/ML IO SOLN
INTRAOCULAR | Status: DC | PRN
  Administered 2017-11-11: 1 mL via INTRAOCULAR

## 2017-11-11 MED ORDER — EPINEPHRINE PF 1 MG/ML IJ SOLN
INTRAMUSCULAR | Status: AC
  Filled 2017-11-11: qty 1

## 2017-11-11 MED ORDER — ARMC OPHTHALMIC DILATING DROPS
1.0000 "application " | OPHTHALMIC | Status: AC
  Administered 2017-11-11: 1 via OPHTHALMIC
  Administered 2017-11-11: 09:00:00 via OPHTHALMIC
  Administered 2017-11-11: 1 via OPHTHALMIC

## 2017-11-11 MED ORDER — LIDOCAINE HCL (PF) 4 % IJ SOLN
INTRAOCULAR | Status: DC | PRN
  Administered 2017-11-11: 2 mL via OPHTHALMIC

## 2017-11-11 SURGICAL SUPPLY — 16 items
GLOVE BIO SURGEON STRL SZ8 (GLOVE) ×3 IMPLANT
GLOVE BIOGEL M 6.5 STRL (GLOVE) ×3 IMPLANT
GLOVE SURG LX 8.0 MICRO (GLOVE) ×2
GLOVE SURG LX STRL 8.0 MICRO (GLOVE) ×1 IMPLANT
GOWN STRL REUS W/ TWL LRG LVL3 (GOWN DISPOSABLE) ×2 IMPLANT
GOWN STRL REUS W/TWL LRG LVL3 (GOWN DISPOSABLE) ×6
LABEL CATARACT MEDS ST (LABEL) ×3 IMPLANT
LENS IOL TECNIS ITEC 21.5 (Intraocular Lens) ×2 IMPLANT
PACK CATARACT (MISCELLANEOUS) ×3 IMPLANT
PACK CATARACT BRASINGTON LX (MISCELLANEOUS) ×3 IMPLANT
PACK EYE AFTER SURG (MISCELLANEOUS) ×3 IMPLANT
SOL BSS BAG (MISCELLANEOUS) ×3
SOLUTION BSS BAG (MISCELLANEOUS) ×1 IMPLANT
SYR 5ML LL (SYRINGE) ×3 IMPLANT
WATER STERILE IRR 250ML POUR (IV SOLUTION) ×3 IMPLANT
WIPE NON LINTING 3.25X3.25 (MISCELLANEOUS) ×3 IMPLANT

## 2017-11-11 NOTE — Anesthesia Post-op Follow-up Note (Signed)
Anesthesia QCDR form completed.        

## 2017-11-11 NOTE — H&P (Signed)
All labs reviewed. Abnormal studies sent to patients PCP when indicated.  Previous H&P reviewed, patient examined, there are NO CHANGES.  Dana Hudson LOUIS12/18/20189:45 AM

## 2017-11-11 NOTE — Transfer of Care (Signed)
Immediate Anesthesia Transfer of Care Note  Patient: Dana Hudson  Procedure(s) Performed: CATARACT EXTRACTION PHACO AND INTRAOCULAR LENS PLACEMENT (IOC) (Left Eye)  Patient Location: PACU  Anesthesia Type:MAC  Level of Consciousness: awake, alert  and oriented  Airway & Oxygen Therapy: Patient Spontanous Breathing  Post-op Assessment: Report given to RN and Post -op Vital signs reviewed and stable  Post vital signs: Reviewed and stable  Last Vitals:  Vitals:   11/11/17 0846  BP: (!) 125/41  Pulse: 63  Resp: 18  Temp: (!) 35.8 C  SpO2: 100%    Last Pain:  Vitals:   11/11/17 0846  TempSrc: Tympanic         Complications: No apparent anesthesia complications

## 2017-11-11 NOTE — Anesthesia Procedure Notes (Signed)
Procedure Name: MAC Performed by: Demetrius Charity, CRNA Pre-anesthesia Checklist: Patient identified, Emergency Drugs available, Suction available, Patient being monitored and Timeout performed Oxygen Delivery Method: Nasal cannula

## 2017-11-11 NOTE — OR Nursing (Signed)
IV removed from right forearm. Site clear.

## 2017-11-11 NOTE — Discharge Instructions (Signed)
Eye Surgery Discharge Instructions  Expect mild scratchy sensation or mild soreness. DO NOT RUB YOUR EYE!  The day of surgery:  Minimal physical activity, but bed rest is not required  No reading, computer work, or close hand work  No bending, lifting, or straining.  May watch TV  For 24 hours:  No driving, legal decisions, or alcoholic beverages  Safety precautions  Eat anything you prefer: It is better to start with liquids, then soup then solid foods.  _____ Eye patch should be worn until postoperative exam tomorrow.  ____ Solar shield eyeglasses should be worn for comfort in the sunlight/patch while sleeping  Resume all regular medications including aspirin or Coumadin if these were discontinued prior to surgery. You may shower, bathe, shave, or wash your hair. Tylenol may be taken for mild discomfort.  Call your doctor if you experience significant pain, nausea, or vomiting, fever > 101 or other signs of infection. [phone] or [phone] Specific instructions:  Follow-up Information    Birder Robson, MD Follow up.   Specialty:  Ophthalmology Why:  December 19 at 10:35am Contact information: [place] [address] [phone]

## 2017-11-11 NOTE — Anesthesia Preprocedure Evaluation (Signed)
Anesthesia Evaluation  Patient identified by MRN, date of birth, ID band Patient awake    Reviewed: Allergy & Precautions, NPO status , Patient's Chart, lab work & pertinent test results  History of Anesthesia Complications Negative for: history of anesthetic complications  Airway Mallampati: III  TM Distance: >3 FB Neck ROM: Full    Dental  (+) Lower Dentures, Upper Dentures   Pulmonary neg pulmonary ROS, neg sleep apnea, neg COPD,    breath sounds clear to auscultation- rhonchi (-) wheezing      Cardiovascular Exercise Tolerance: Good (-) hypertension(-) CAD, (-) Past MI and (-) Cardiac Stents + dysrhythmias (s/p ablation) Supra Ventricular Tachycardia  Rhythm:Regular Rate:Normal - Systolic murmurs and - Diastolic murmurs    Neuro/Psych Dementia  negative psych ROS   GI/Hepatic Neg liver ROS, GERD  ,  Endo/Other  neg diabetesHypothyroidism   Renal/GU negative Renal ROS     Musculoskeletal  (+) Arthritis ,   Abdominal (+) - obese,   Peds  Hematology  (+) anemia ,   Anesthesia Other Findings Past Medical History: No date: Anemia No date: Arthritis No date: Cancer (HCC)     Comment:  SKIN No date: Chronic kidney disease     Comment:  STAGE 3 No date: Dementia No date: Dysrhythmia No date: Edema No date: GERD (gastroesophageal reflux disease) No date: HOH (hard of hearing)     Comment:  AIDS No date: Hypothyroidism No date: Palpitations No date: Parkinson disease (HCC) No date: Paroxysmal supraventricular tachycardia (HCC)     Comment:  SVT/PSVT/PAT   Reproductive/Obstetrics                             Anesthesia Physical Anesthesia Plan  ASA: III  Anesthesia Plan: MAC   Post-op Pain Management:    Induction: Intravenous  PONV Risk Score and Plan: 2 and Midazolam  Airway Management Planned: Natural Airway  Additional Equipment:   Intra-op Plan:   Post-operative  Plan:   Informed Consent: I have reviewed the patients History and Physical, chart, labs and discussed the procedure including the risks, benefits and alternatives for the proposed anesthesia with the patient or authorized representative who has indicated his/her understanding and acceptance.     Plan Discussed with: CRNA and Anesthesiologist  Anesthesia Plan Comments:         Anesthesia Quick Evaluation

## 2017-11-11 NOTE — Anesthesia Postprocedure Evaluation (Signed)
Anesthesia Post Note  Patient: Dana Hudson  Procedure(s) Performed: CATARACT EXTRACTION PHACO AND INTRAOCULAR LENS PLACEMENT (IOC) (Left Eye)  Patient location during evaluation: PACU Anesthesia Type: MAC Level of consciousness: awake and alert and oriented Pain management: pain level controlled Vital Signs Assessment: post-procedure vital signs reviewed and stable Respiratory status: spontaneous breathing, nonlabored ventilation and respiratory function stable Cardiovascular status: blood pressure returned to baseline and stable Postop Assessment: no signs of nausea or vomiting Anesthetic complications: no     Last Vitals:  Vitals:   11/11/17 0846 11/11/17 1015  BP: (!) 125/41 (!) 166/79  Pulse: 63 64  Resp: 18 16  Temp: (!) 35.8 C (!) 36.3 C  SpO2: 100% 96%    Last Pain:  Vitals:   11/11/17 0846  TempSrc: Tympanic                 Kelsen Celona

## 2017-11-11 NOTE — Op Note (Signed)
PREOPERATIVE DIAGNOSIS:  Nuclear sclerotic cataract of the left eye.   POSTOPERATIVE DIAGNOSIS:  Nuclear sclerotic cataract of the left eye.   OPERATIVE PROCEDURE: Procedure(s): CATARACT EXTRACTION PHACO AND INTRAOCULAR LENS PLACEMENT (IOC)   SURGEON:  Birder Robson, MD.   ANESTHESIA:  Anesthesiologist: Emmie Niemann, MD CRNA: Demetrius Charity, CRNA  1.      Managed anesthesia care. 2.     0.2ml of Shugarcaine was instilled following the paracentesis   COMPLICATIONS:  None.   TECHNIQUE:   Stop and chop   DESCRIPTION OF PROCEDURE:  The patient was examined and consented in the preoperative holding area where the aforementioned topical anesthesia was applied to the left eye and then brought back to the Operating Room where the left eye was prepped and draped in the usual sterile ophthalmic fashion and a lid speculum was placed. A paracentesis was created with the side port blade and the anterior chamber was filled with viscoelastic. A near clear corneal incision was performed with the steel keratome. A continuous curvilinear capsulorrhexis was performed with a cystotome followed by the capsulorrhexis forceps. Hydrodissection and hydrodelineation were carried out with BSS on a blunt cannula. The lens was removed in a stop and chop  technique and the remaining cortical material was removed with the irrigation-aspiration handpiece. The capsular bag was inflated with viscoelastic and the Technis [id number] lens was placed in the capsular bag without complication. The remaining viscoelastic was removed from the eye with the irrigation-aspiration handpiece. The wounds were hydrated. The anterior chamber was flushed with Miostat and the eye was inflated to physiologic pressure. 0.80ml Vigamox was placed in the anterior chamber. The wounds were found to be water tight. The eye was dressed with Vigamox. The patient was given protective glasses to wear throughout the day and a shield with which to sleep  tonight. The patient was also given drops with which to begin a drop regimen today and will follow-up with me in one day. Implant Name Type Inv. Item Serial No. Manufacturer Lot No. LRB No. Used  LENS IOL DIOP 21.5 - [id number] Intraocular Lens LENS IOL DIOP 21.5 [id number]  Left 1    Procedure(s) with comments: CATARACT EXTRACTION PHACO AND INTRAOCULAR LENS PLACEMENT (IOC) (Left) - [place] 00:41 AP% 15.1 CDE 6.22 Fluid pack lot # [id number]  Electronically signed: [place] 11/11/2017 10:12 AM

## 2017-11-12 ENCOUNTER — Encounter: Payer: Self-pay | Admitting: Ophthalmology

## 2019-11-20 ENCOUNTER — Emergency Department: Payer: Medicare (Managed Care)

## 2019-11-20 ENCOUNTER — Other Ambulatory Visit: Payer: Self-pay

## 2019-11-20 ENCOUNTER — Encounter: Payer: Self-pay | Admitting: Emergency Medicine

## 2019-11-20 ENCOUNTER — Inpatient Hospital Stay
Admission: EM | Admit: 2019-11-20 | Discharge: 2019-11-24 | DRG: 871 | Disposition: A | Discharge status: Skilled Nursing Facility | Payer: Medicare (Managed Care) | Attending: Internal Medicine | Admitting: Internal Medicine

## 2019-11-20 DIAGNOSIS — Z6824 Body mass index (BMI) 24.0-24.9, adult: Secondary | ICD-10-CM

## 2019-11-20 DIAGNOSIS — Z20828 Contact with and (suspected) exposure to other viral communicable diseases: Secondary | ICD-10-CM | POA: Diagnosis present

## 2019-11-20 DIAGNOSIS — R410 Disorientation, unspecified: Secondary | ICD-10-CM | POA: Diagnosis not present

## 2019-11-20 DIAGNOSIS — K219 Gastro-esophageal reflux disease without esophagitis: Secondary | ICD-10-CM | POA: Diagnosis present

## 2019-11-20 DIAGNOSIS — Z515 Encounter for palliative care: Secondary | ICD-10-CM

## 2019-11-20 DIAGNOSIS — A419 Sepsis, unspecified organism: Secondary | ICD-10-CM | POA: Diagnosis present

## 2019-11-20 DIAGNOSIS — N390 Urinary tract infection, site not specified: Secondary | ICD-10-CM | POA: Diagnosis not present

## 2019-11-20 DIAGNOSIS — R6520 Severe sepsis without septic shock: Secondary | ICD-10-CM

## 2019-11-20 DIAGNOSIS — R778 Other specified abnormalities of plasma proteins: Secondary | ICD-10-CM

## 2019-11-20 DIAGNOSIS — I129 Hypertensive chronic kidney disease with stage 1 through stage 4 chronic kidney disease, or unspecified chronic kidney disease: Secondary | ICD-10-CM | POA: Diagnosis present

## 2019-11-20 DIAGNOSIS — R531 Weakness: Secondary | ICD-10-CM

## 2019-11-20 DIAGNOSIS — N179 Acute kidney failure, unspecified: Secondary | ICD-10-CM | POA: Diagnosis present

## 2019-11-20 DIAGNOSIS — R627 Adult failure to thrive: Secondary | ICD-10-CM

## 2019-11-20 DIAGNOSIS — Z66 Do not resuscitate: Secondary | ICD-10-CM

## 2019-11-20 DIAGNOSIS — G9341 Metabolic encephalopathy: Secondary | ICD-10-CM | POA: Diagnosis not present

## 2019-11-20 DIAGNOSIS — R55 Syncope and collapse: Secondary | ICD-10-CM | POA: Diagnosis present

## 2019-11-20 DIAGNOSIS — Z806 Family history of leukemia: Secondary | ICD-10-CM

## 2019-11-20 DIAGNOSIS — J449 Chronic obstructive pulmonary disease, unspecified: Secondary | ICD-10-CM | POA: Diagnosis present

## 2019-11-20 DIAGNOSIS — H9190 Unspecified hearing loss, unspecified ear: Secondary | ICD-10-CM | POA: Diagnosis present

## 2019-11-20 DIAGNOSIS — I472 Ventricular tachycardia: Secondary | ICD-10-CM | POA: Diagnosis present

## 2019-11-20 DIAGNOSIS — M6282 Rhabdomyolysis: Secondary | ICD-10-CM | POA: Diagnosis present

## 2019-11-20 DIAGNOSIS — I248 Other forms of acute ischemic heart disease: Secondary | ICD-10-CM | POA: Diagnosis present

## 2019-11-20 DIAGNOSIS — A4151 Sepsis due to Escherichia coli [E. coli]: Secondary | ICD-10-CM | POA: Diagnosis not present

## 2019-11-20 DIAGNOSIS — E039 Hypothyroidism, unspecified: Secondary | ICD-10-CM | POA: Diagnosis present

## 2019-11-20 DIAGNOSIS — N183 Chronic kidney disease, stage 3 unspecified: Secondary | ICD-10-CM | POA: Diagnosis present

## 2019-11-20 DIAGNOSIS — R636 Underweight: Secondary | ICD-10-CM | POA: Diagnosis present

## 2019-11-20 DIAGNOSIS — I517 Cardiomegaly: Secondary | ICD-10-CM | POA: Diagnosis present

## 2019-11-20 DIAGNOSIS — E860 Dehydration: Secondary | ICD-10-CM | POA: Diagnosis present

## 2019-11-20 DIAGNOSIS — G9340 Encephalopathy, unspecified: Secondary | ICD-10-CM

## 2019-11-20 DIAGNOSIS — F0280 Dementia in other diseases classified elsewhere without behavioral disturbance: Secondary | ICD-10-CM | POA: Diagnosis present

## 2019-11-20 DIAGNOSIS — R7989 Other specified abnormal findings of blood chemistry: Secondary | ICD-10-CM | POA: Diagnosis present

## 2019-11-20 DIAGNOSIS — G20 Parkinson's disease: Secondary | ICD-10-CM | POA: Diagnosis present

## 2019-11-20 DIAGNOSIS — Z96659 Presence of unspecified artificial knee joint: Secondary | ICD-10-CM | POA: Diagnosis present

## 2019-11-20 LAB — URINALYSIS, COMPLETE (UACMP) WITH MICROSCOPIC
Bilirubin Urine: NEGATIVE
Glucose, UA: NEGATIVE mg/dL
Ketones, ur: NEGATIVE mg/dL
Nitrite: NEGATIVE
Protein, ur: 30 mg/dL — AB
Specific Gravity, Urine: 1.013 (ref 1.005–1.030)
WBC, UA: >50 WBC/hpf — ABNORMAL HIGH (ref 0–5)
pH: 5 (ref 5.0–8.0)

## 2019-11-20 LAB — COMPREHENSIVE METABOLIC PANEL
ALT: 14 U/L (ref 0–44)
AST: 55 U/L — ABNORMAL HIGH (ref 15–41)
Albumin: 4 g/dL (ref 3.5–5.0)
Alkaline Phosphatase: 36 U/L — ABNORMAL LOW (ref 38–126)
Anion gap: 14 (ref 5–15)
BUN: 43 mg/dL — ABNORMAL HIGH (ref 8–23)
CO2: 21 mmol/L — ABNORMAL LOW (ref 22–32)
Calcium: 8.9 mg/dL (ref 8.9–10.3)
Chloride: 107 mmol/L (ref 98–111)
Creatinine, Ser: 1.69 mg/dL — ABNORMAL HIGH (ref 0.44–1.00)
GFR calc Af Amer: 30 mL/min — ABNORMAL LOW (ref >60)
GFR calc non Af Amer: 26 mL/min — ABNORMAL LOW (ref >60)
Glucose, Bld: 155 mg/dL — ABNORMAL HIGH (ref 70–99)
Potassium: 3.8 mmol/L (ref 3.5–5.1)
Sodium: 142 mmol/L (ref 135–145)
Total Bilirubin: 1.2 mg/dL (ref 0.3–1.2)
Total Protein: 6.4 g/dL — ABNORMAL LOW (ref 6.5–8.1)

## 2019-11-20 LAB — CBC WITH DIFFERENTIAL/PLATELET
Abs Immature Granulocytes: 0.09 10*3/uL — ABNORMAL HIGH (ref 0.00–0.07)
Basophils Absolute: 0.1 10*3/uL (ref 0.0–0.1)
Basophils Relative: 0 %
Eosinophils Absolute: 0.1 10*3/uL (ref 0.0–0.5)
Eosinophils Relative: 1 %
HCT: 36.6 % (ref 36.0–46.0)
Hemoglobin: 12.1 g/dL (ref 12.0–15.0)
Immature Granulocytes: 1 %
Lymphocytes Relative: 6 %
Lymphs Abs: 0.9 10*3/uL (ref 0.7–4.0)
MCH: 29.9 pg (ref 26.0–34.0)
MCHC: 33.1 g/dL (ref 30.0–36.0)
MCV: 90.4 fL (ref 80.0–100.0)
Monocytes Absolute: 1.3 10*3/uL — ABNORMAL HIGH (ref 0.1–1.0)
Monocytes Relative: 9 %
Neutro Abs: 12.3 10*3/uL — ABNORMAL HIGH (ref 1.7–7.7)
Neutrophils Relative %: 83 %
Platelets: 189 10*3/uL (ref 150–400)
RBC: 4.05 MIL/uL (ref 3.87–5.11)
RDW: 13.1 % (ref 11.5–15.5)
WBC: 14.7 10*3/uL — ABNORMAL HIGH (ref 4.0–10.5)
nRBC: 0 % (ref 0.0–0.2)

## 2019-11-20 LAB — TROPONIN I (HIGH SENSITIVITY): Troponin I (High Sensitivity): 279 ng/L (ref <18)

## 2019-11-20 LAB — LACTIC ACID, PLASMA: Lactic Acid, Venous: 1.5 mmol/L (ref 0.5–1.9)

## 2019-11-20 LAB — CK: Total CK: 1183 U/L — ABNORMAL HIGH (ref 38–234)

## 2019-11-20 LAB — PROCALCITONIN: Procalcitonin: <0.1 ng/mL

## 2019-11-20 MED ORDER — SODIUM CHLORIDE 0.9 % IV BOLUS
500.0000 mL | Freq: Once | INTRAVENOUS | Status: AC
  Administered 2019-11-20: 500 mL via INTRAVENOUS

## 2019-11-20 MED ORDER — ENOXAPARIN SODIUM 40 MG/0.4ML ~~LOC~~ SOLN
30.0000 mg | SUBCUTANEOUS | Status: DC
  Administered 2019-11-21 – 2019-11-22 (×2): 30 mg via SUBCUTANEOUS
  Filled 2019-11-20 (×2): qty 0.4
  Filled 2019-11-20: qty 0.3

## 2019-11-20 MED ORDER — PANTOPRAZOLE SODIUM 40 MG PO TBEC
40.0000 mg | DELAYED_RELEASE_TABLET | Freq: Every day | ORAL | Status: DC
  Administered 2019-11-21 – 2019-11-22 (×2): 40 mg via ORAL
  Filled 2019-11-20 (×2): qty 1

## 2019-11-20 MED ORDER — SODIUM CHLORIDE 0.9 % IV SOLN
1.0000 g | INTRAVENOUS | Status: DC
  Administered 2019-11-21: 1 g via INTRAVENOUS
  Filled 2019-11-20: qty 10
  Filled 2019-11-20: qty 1

## 2019-11-20 MED ORDER — ONDANSETRON HCL 4 MG PO TABS
4.0000 mg | ORAL_TABLET | Freq: Four times a day (QID) | ORAL | Status: DC | PRN
  Filled 2019-11-20: qty 1

## 2019-11-20 MED ORDER — ONDANSETRON HCL 4 MG/2ML IJ SOLN: 4.0000 mg | Freq: Four times a day (QID) | INTRAMUSCULAR | Status: DC | PRN

## 2019-11-20 MED ORDER — LEVOTHYROXINE SODIUM 88 MCG PO TABS
88.0000 ug | ORAL_TABLET | Freq: Every day | ORAL | Status: DC
  Administered 2019-11-22: 88 ug via ORAL
  Filled 2019-11-20 (×2): qty 1

## 2019-11-20 MED ORDER — ACETAMINOPHEN 325 MG PO TABS: 650.0000 mg | ORAL_TABLET | Freq: Four times a day (QID) | ORAL | Status: DC | PRN

## 2019-11-20 MED ORDER — SODIUM CHLORIDE 0.9 % IV SOLN
1.0000 g | Freq: Once | INTRAVENOUS | Status: AC
  Administered 2019-11-20: 1 g via INTRAVENOUS
  Filled 2019-11-20: qty 10

## 2019-11-20 MED ORDER — ACETAMINOPHEN 650 MG RE SUPP: 650.0000 mg | Freq: Four times a day (QID) | RECTAL | Status: DC | PRN

## 2019-11-20 MED ORDER — SODIUM CHLORIDE 0.9 % IV SOLN: INTRAVENOUS | Status: AC

## 2019-11-20 NOTE — H&P (Signed)
CARON ODE XID:[id number] DOB: June 04, 1924 DOA: 11/20/2019     PCP: System, Pcp Not In   Outpatient Specialists:   CARDS:  Dr. Caryl Comes    Patient arrived to ER on 11/20/19 at [place]  Patient coming from: home Lives alone,      Chief Complaint:   Chief Complaint  Patient presents with  . Altered Mental Status  . Fall    HPI: Dana Hudson is a 85 y.o. female with medical history significant of dementia, HTN, CKD stage III, hypothyroidism, HLD, Anemia, Dysphagia, SVT, h/o of syncope, COPD, Benign essential tremor, Thrombocytopenia    Presented with confusion fall unable to provide history patient has significant dementia lives at home by herself neighbors noted that she has been having auditory and visual hallucinations and foul-smelling urine.  Patient herself unable to provide any other history A bit able to finally get a hold of the family her son reported increased confusion apparently son lives next door to her.  She has care-givers who come to see her daily  Family found her on the floor unclear how long she was on the floor Blood was everywhere She urinated on herself  While family was with her she stated she felt hot and went limp This episode lasted for 30 sec She was very fatigued She could hardly stand up No cough,  She has not been eating today   Sh has mild dementia walks with a cane At baseline she needs help with basic ADLS No hx of CAD  One of her aids was COVID positive  patient was tested and was negative 2-3 weeks  Infectious risk factors:  Reports confusion   In  ER    PCR testing  Pending  No results found for: SARSCOV2NAA   Regarding pertinent Chronic problems:     Hyperlipidemia -  Not on statins   HTN on Diovan    Hypothyroidism: on synthroid      COPD - not  on baseline oxygen   L, mild      CKD stage III - baseline Cr  1.5 Lab Results  Component Value Date   CREATININE 1.69 (H) 11/20/2019    While in ER:  Chest x-ray  showing cardiomegaly UA worrisome for UTI Started on ceftriaxone Urine culture was sent The following Work up has been ordered so far:  Orders Placed This Encounter  Procedures  . Urine culture  . SARS CORONAVIRUS 2 (TAT 6-24 HRS) Nasopharyngeal Nasopharyngeal Swab  . DG Chest Port 1 View  . CT Head Wo Contrast  . Urinalysis, Complete w Microscopic  . Comprehensive metabolic panel  . CBC with Differential  . Consult to hospitalist  ALL PATIENTS BEING ADMITTED/HAVING PROCEDURES NEED COVID-19 SCREENING  . EKG 12-Lead  . EKG 12-Lead  . EKG 12-Lead  . ED EKG     Following Medications were ordered in ER: Medications  sodium chloride 0.9 % bolus 500 mL (500 mLs Intravenous New Bag/Given 11/20/19 2107)  cefTRIAXone (ROCEPHIN) 1 g in sodium chloride 0.9 % 100 mL IVPB (0 g Intravenous Stopped 11/20/19 2143)        Consult Orders  (From admission, onward)         Start     Ordered   11/20/19 2058  Consult to hospitalist  ALL PATIENTS BEING ADMITTED/HAVING PROCEDURES NEED COVID-19 SCREENING  Once    Comments: ALL PATIENTS BEING ADMITTED/HAVING PROCEDURES NEED COVID-19 SCREENING  Provider:  (Not yet assigned)  Question Answer Comment  Place call to: hospitalist   Reason for Consult Admit   Diagnosis/Clinical Info for Consult: uti, confusion      11/20/19 2057           Significant initial  Findings: Abnormal Labs Reviewed  URINALYSIS, COMPLETE (UACMP) WITH MICROSCOPIC - Abnormal; Notable for the following components:      Result Value   Color, Urine YELLOW (*)    APPearance CLOUDY (*)    Hgb urine dipstick SMALL (*)    Protein, ur 30 (*)    Leukocytes,Ua LARGE (*)    WBC, UA >50 (*)    Bacteria, UA MANY (*)    All other components within normal limits  COMPREHENSIVE METABOLIC PANEL - Abnormal; Notable for the following components:   CO2 21 (*)    Glucose, Bld 155 (*)    BUN 43 (*)    Creatinine, Ser 1.69 (*)    Total Protein 6.4 (*)    AST 55 (*)    Alkaline  Phosphatase 36 (*)    GFR calc non Af Amer 26 (*)    GFR calc Af Amer 30 (*)    All other components within normal limits  CBC WITH DIFFERENTIAL/PLATELET - Abnormal; Notable for the following components:   WBC 14.7 (*)    Neutro Abs 12.3 (*)    Monocytes Absolute 1.3 (*)    Abs Immature Granulocytes 0.09 (*)    All other components within normal limits     Otherwise labs showing:    Recent Labs  Lab 11/20/19 1946  NA 142  K 3.8  CO2 21*  GLUCOSE 155*  BUN 43*  CREATININE 1.69*  CALCIUM 8.9    Cr    stable,   Lab Results  Component Value Date   CREATININE 1.69 (H) 11/20/2019    Recent Labs  Lab 11/20/19 1946  AST 55*  ALT 14  ALKPHOS 36*  BILITOT 1.2  PROT 6.4*  ALBUMIN 4.0   Lab Results  Component Value Date   CALCIUM 8.9 11/20/2019       WBC      Component Value Date/Time   WBC 14.7 (H) 11/20/2019 1946   ANC    Component Value Date/Time   NEUTROABS 12.3 (H) 11/20/2019 1946   ALC No components found for: LYMPHAB    Plt: Lab Results  Component Value Date   PLT 189 11/20/2019  Level low level okay mildly patient has digitorum cornea bicarb 12 Lactic Acid, Venous No results found for: LATICACIDVEN  Procalcitonin <0.1   COVID-19 Labs  No results for input(s): DDIMER, FERRITIN, LDH, CRP in the last 72 hours.  No results found for: SARSCOV2NAA   HG/HCT   stable,       Component Value Date/Time   HGB 12.1 11/20/2019 1946   HCT 36.6 11/20/2019 1946     Troponin  250 Cardiac Panel (last 3 results) Recent Labs    11/20/19 2245  CKTOTAL 1,183*       ECG: Ordered Personally reviewed by me showing: HR : 83 Rhythm: possible A.fib.     no evidence of ischemic changes QTC 461      UA   evidence of UTI      Urine analysis:    Component Value Date/Time   COLORURINE YELLOW (A) 11/20/2019 1936   APPEARANCEUR CLOUDY (A) 11/20/2019 1936   LABSPEC 1.013 11/20/2019 1936   PHURINE 5.0 11/20/2019 1936   GLUCOSEU NEGATIVE 11/20/2019  1936   HGBUR SMALL (A) 11/20/2019 1936  BILIRUBINUR NEGATIVE 11/20/2019 1936   KETONESUR NEGATIVE 11/20/2019 1936   PROTEINUR 30 (A) 11/20/2019 1936   NITRITE NEGATIVE 11/20/2019 1936   LEUKOCYTESUR LARGE (A) 11/20/2019 1936       Ordered    CXR -  Cardiomegally   CT head non acute   ED Triage Vitals  Enc Vitals Group     BP 11/20/19 1932 129/69     Pulse Rate 11/20/19 1932 67     Resp 11/20/19 1932 18     Temp 11/20/19 1932 99.2 F (37.3 C)     Temp Source 11/20/19 1932 Oral     SpO2 11/20/19 1932 99 %     Weight 11/20/19 1933 150 lb (68 kg)     Height 11/20/19 1933 _0  (1.651 m)     Head Circumference --      Peak Flow --      Pain Score --      Pain Loc --      Pain Edu? --      Excl. in [place]? --   TMAX(24)@       Latest  Blood pressure 108/78, pulse 84, temperature 99.2 F (37.3 C), temperature source Oral, resp. rate 19, height _1  (1.651 m), weight 68 kg, SpO2 99 %.    Hospitalist was called for admission for UTI   Review of Systems:    Pertinent positives include: confusion  Constitutional:  No weight loss, night sweats, Fevers, chills, fatigue, weight loss  HEENT:  No headaches, Difficulty swallowing,Tooth/dental problems,Sore throat,  No sneezing, itching, ear ache, nasal congestion, post nasal drip,  Cardio-vascular:  No chest pain, Orthopnea, PND, anasarca, dizziness, palpitations.no Bilateral lower extremity swelling  GI:  No heartburn, indigestion, abdominal pain, nausea, vomiting, diarrhea, change in bowel habits, loss of appetite, melena, blood in stool, hematemesis Resp:  no shortness of breath at rest. No dyspnea on exertion, No excess mucus, no productive cough, No non-productive cough, No coughing up of blood.No change in color of mucus.No wheezing. Skin:  no rash or lesions. No jaundice GU:  no dysuria, change in color of urine, no urgency or frequency. No straining to urinate.  No flank pain.  Musculoskeletal:  No joint pain or no  joint swelling. No decreased range of motion. No back pain.  Psych:  No change in mood or affect. No depression or anxiety. No memory loss.  Neuro: no localizing neurological complaints, no tingling, no weakness, no double vision, no gait abnormality, no slurred speech, no confusion  All systems reviewed and apart from [place] all are negative  Past Medical History:   Past Medical History:  Diagnosis Date  . Anemia   . Arthritis   . Cancer ([place])    SKIN  . Chronic kidney disease    STAGE 3  . Dementia ([place])   . Dementia ([place])   . Dysrhythmia   . Edema   . GERD (gastroesophageal reflux disease)   . HOH (hard of hearing)    AIDS  . Hypothyroidism   . Palpitations   . Parkinson disease ([place])   . Paroxysmal supraventricular tachycardia [facility])    SVT/PSVT/PAT      Past Surgical History:  Procedure Laterality Date  . ABDOMINAL HYSTERECTOMY    . CARDIAC ELECTROPHYSIOLOGY STUDY AND ABLATION    . CATARACT EXTRACTION W/PHACO Right 10/14/2017   Procedure: CATARACT EXTRACTION PHACO AND INTRAOCULAR LENS PLACEMENT (IOC);  Surgeon: Birder Robson, MD;  Location: [facility] ORS;  Service: Ophthalmology;  Laterality: Right;  [place] 01:04 AP% 16.9 CDE 10.96 Fluid pack lot # [id number]  . CATARACT EXTRACTION W/PHACO Left 11/11/2017   Procedure: CATARACT EXTRACTION PHACO AND INTRAOCULAR LENS PLACEMENT (IOC);  Surgeon: Birder Robson, MD;  Location: [facility] ORS;  Service: Ophthalmology;  Laterality: Left;  [place] 00:41 AP% 15.1 CDE 6.22 Fluid pack lot # [id number]  . JOINT REPLACEMENT     TOTAL KNEE  . total knee arthroplasty      Social History:  Ambulatory  Cane,     has an unknown smoking status. She has never used smokeless tobacco. She reports that she does not drink alcohol or use drugs.   Family History:   History reviewed. No pertinent family history.  Allergies: No Known Allergies   Prior to Admission medications   Medication Sig Start Date End Date Taking? Authorizing Provider    Calcium Carbonate (CALTRATE 600 PO) Take 600 mg by mouth daily.    [provider]  Difluprednate (DUREZOL) 0.05 % EMUL Apply 1 drop to eye 2 (two) times daily. Operative eye    [provider]  ferrous sulfate 325 (65 FE) MG tablet Take 325 mg by mouth daily with breakfast.    [provider]  levothyroxine (SYNTHROID, LEVOTHROID) 88 MCG tablet Take 88 mcg daily before breakfast by mouth.    [provider]  nepafenac (ILEVRO) 0.3 % ophthalmic suspension 1 drop daily. Into operative eye    [provider]  omeprazole (PRILOSEC) 20 MG capsule Take 20 mg daily by mouth.    [provider]  valsartan-hydrochlorothiazide (DIOVAN HCT) 80-12.5 MG tablet Take 1 tablet daily by mouth.    [provider]   Physical Exam: Blood pressure 108/78, pulse 84, temperature 99.2 F (37.3 C), temperature source Oral, resp. rate 19, height _0  (1.651 m), weight 68 kg, SpO2 99 %. 1. General:  in No  increased work of breathing     Chronically ill  -appearing 2. Psychological: Alert and  Oriented 3. Head/ENT:    Dry Mucous Membranes                          Head Non traumatic, neck supple                           Poor Dentition 4. SKIN:  decreased Skin turgor,  Skin clean Dry and intact no rash 5. Heart: Regular rate and rhythm no  Murmur, no Rub or gallop 6. Lungs: no wheezes or crackles   7. Abdomen: Soft,  non-tender, Non distended bowel sounds present 8. Lower extremities: no clubbing, cyanosis, no  edema 9. Neurologically Grossly intact, moving all 4 extremities equally  10. MSK: Normal range of motion   All other LABS:     Recent Labs  Lab 11/20/19 1946  WBC 14.7*  NEUTROABS 12.3*  HGB 12.1  HCT 36.6  MCV 90.4  PLT 189     Recent Labs  Lab 11/20/19 1946  NA 142  K 3.8  CL 107  CO2 21*  GLUCOSE 155*  BUN 43*  CREATININE 1.69*  CALCIUM 8.9     Recent Labs  Lab 11/20/19 1946  AST 55*  ALT 14  ALKPHOS 36*   BILITOT 1.2  PROT 6.4*  ALBUMIN 4.0       Cultures: No results found for: SDES, SPECREQUEST, CULT, REPTSTATUS   Radiological Exams on Admission: CT Head Wo Contrast  Result Date: 11/20/2019  CLINICAL DATA:  Altered mental status. Possible fall. History of dementia. No given history of malignancy. EXAM: CT HEAD WITHOUT CONTRAST TECHNIQUE: Contiguous axial images were obtained from the base of the skull through the vertex without intravenous contrast. COMPARISON:  Report only from PET-CT 01/14/2003. Whole-body bone scan 04/08/2007. FINDINGS: Brain: There is no evidence of acute intracranial hemorrhage, mass lesion, brain edema or extra-axial fluid collection. There is atrophy with prominence of the ventricles and subarachnoid spaces. Minimal chronic small vessel ischemic changes in the periventricular white matter. There is no CT evidence of acute cortical infarction. Vascular: Mild intracranial vascular calcifications. No hyperdense vessel identified. Skull: There is a 1.7 cm lucent lesion in the left parietal bone on image 59/2. This appears nonaggressive and may reflect a hemangioma. No acute osseous findings. Sinuses/Orbits: The visualized paranasal sinuses and mastoid air cells are clear. Previous bilateral lens surgery. Other: Right TMJ osteoarthritis. IMPRESSION: 1. No acute intracranial or calvarial findings. 2. Lucent lesion in the left parietal bone, unlikely to be clinically significant in this 85 year old, and possibly a hemangioma. Electronically Signed   By: Richardean Sale M.D.   On: 11/20/2019 20:49   DG Chest Port 1 View  Result Date: 11/20/2019 CLINICAL DATA:  Increased confusion, foul smelling urine, history dementia, stage III chronic kidney disease, Parkinson's, GERD EXAM: PORTABLE CHEST 1 VIEW COMPARISON:  Portable exam 1936 hours compared to 06/13/2007 FINDINGS: Enlargement of cardiac silhouette. Mediastinal contours and pulmonary vascularity normal. Atherosclerotic  calcification aorta. Subsegmental atelectasis LEFT base. Lungs otherwise clear. No pulmonary infiltrate, pleural effusion or pneumothorax. Bones demineralized. IMPRESSION: Enlargement of cardiac silhouette with subsegmental atelectasis LEFT base. Electronically Signed   By: [name]   On: 11/20/2019 20:04    Chart has been reviewed    Assessment/Plan   85 y.o. female with medical history significant of dementia, HTN, CKD stage III, hypothyroidism, HLD, Anemia, Dysphagia, SVT, h/o of syncope, COPD, Benign essential tremor, Thrombocytopenia  Admitted for Sepsis, syncope  Present on Admission: Acute metabolic encephalopathy -   - most likely multifactorial secondary to combination of  infection  dehydration secondary to decreased by mouth intake,    - Will rehydrate    - treat underlining infection     - if no improvement may need further imaging to evaluate for CNS pathology pathology such as MRI of the brain   - neurological exam appears to be nonfocal but patient unable to cooperate fully    . Sepsis ([place]) -  -SIRS criteria met with  elevated white blood cell count,  tachycardia ,     With/without evidence of end organ damage such as acute renal failure,  encephalopathy -Most likely source being  Urinary,   - Obtain serial lactic acid and procalcitonin level.  - Initiate IV antibiotics   - await results of blood and urine culture  - Rehydrate   . Dehydration - will rehydrate  . Syncope -  given risk factor will admit rehydrate obtain CE, monitor on tele and obtain carotid dopplers, echo  . Acute lower UTI -  - treat with Rocephin        await results of urine culture and adjust antibiotic coverage as needed  . Cardiomegaly - echo in AM   Possible a.fib on ECG - will repeat ECG She had hx of A.fib per family and had ablation per epic it was SVT Pt is at high risk for falls would not be candidate for  anticoagulation HR  In 80's Will obtain echo  rhabdomyolysis - will  rehydrate and recheck in Am  Elevated trop -Patient currently denies any chest pain  Probably demand ischemia in the setting of dehydration   nfection.  - EKG without evidence of acute ischemia  - Admitted to telemetry   - continue to cycle cardiac enzymes   - obtain echogram in AM  - cardiology consult if persists    Other plan as per orders.  DVT prophylaxis:  Lovenox     Code Status:  FULL CODE  as per  family  I had personally discussed CODE STATUS with  family   Family Communication:   Family not at  Bedside  plan of care was discussed on the phone with   Son,   Disposition Plan:    To home once workup is complete and patient is stable                     Would benefit from PT/OT eval prior to DC  Ordered                   Swallow eval - SLP ordered                                       Nutrition    consulted                                      Consults called:  none    Admission status:  ED Disposition    ED Disposition Condition [place]: [place] [[id number]]  Level of Care: Telemetry [5]  Covid Evaluation: Symptomatic Person Under Investigation (PUI)  Diagnosis: Sepsis ([place]) [[id number]]  Admitting Physician: Toy Baker [[id number]]  Attending Physician: Toy Baker [[id number]]       Obs      Level of care    tele  For 12H     Precautions: admitted as  PUI   No active isolations  If Covid PCR is negative  - please DC precautions    PPE: Used by the provider:   P100  eye Goggles,  Gloves  gown     Don Tiu 11/21/2019, 12:16 AM    [facility]     after 2 AM please page floor coverage PA If 7AM-7PM, please contact the day team taking care of the patient using [email or web address]

## 2019-11-20 NOTE — ED Notes (Signed)
Blood cultures and lactic acid ordered after antibiotic order.

## 2019-11-20 NOTE — ED Notes (Signed)
Date and time results received: 11/20/19 2355 (use smartphrase ".now" to insert current time)  Test: troponin Critical Value: 279  Name of Provider Notified: Gershon Mussel  Orders Received? Or Actions Taken?: Actions Taken: acknowledged

## 2019-11-20 NOTE — ED Notes (Signed)
Patient transported to CT 

## 2019-11-20 NOTE — ED Notes (Signed)
Patient is resting, waiting for hospital bed to arrive so this RN can place patient in bed with alarm. No complaints at present, VSS

## 2019-11-20 NOTE — ED Provider Notes (Signed)
[facility] Emergency Department Provider Note  ____________________________________________   First MD Initiated Contact with Patient 11/20/19 1937     (approximate)  I have reviewed the triage vital signs and the nursing notes.  History  Chief Complaint Altered Mental Status and Fall    HPI Dana Hudson is a 85 y.o. female with a history of dementia, Parkinson's, CKD, paroxysmal SVT, hypothyroidism who presents to the emergency department for increased confusion above baseline, foul-smelling odor, concern for UTI.   Also some concern for a possible fall versus syncope, however the patient herself has no complaints. Reportedly lives at home independently, son lives next door.  She does seem to be hallucinating objects and talking to different people in the room.  Cardiac, history limited due to patient's history of dementia, obtained primarily through EMS and supplemental history from son via phone.   Past Medical Hx Past Medical History:  Diagnosis Date  . Anemia   . Arthritis   . Cancer ([place])    SKIN  . Chronic kidney disease    STAGE 3  . Dementia ([place])   . Dementia ([place])   . Dysrhythmia   . Edema   . GERD (gastroesophageal reflux disease)   . HOH (hard of hearing)    AIDS  . Hypothyroidism   . Palpitations   . Parkinson disease ([place])   . Paroxysmal supraventricular tachycardia [facility])    SVT/PSVT/PAT    Problem List Patient Active Problem List   Diagnosis Date Noted  . SVT/ PSVT/ PAT 05/02/2009  . EDEMA 05/02/2009  . PALPITATIONS 05/02/2009    Past Surgical Hx Past Surgical History:  Procedure Laterality Date  . ABDOMINAL HYSTERECTOMY    . CARDIAC ELECTROPHYSIOLOGY STUDY AND ABLATION    . CATARACT EXTRACTION W/PHACO Right 10/14/2017   Procedure: CATARACT EXTRACTION PHACO AND INTRAOCULAR LENS PLACEMENT (IOC);  Surgeon: Birder Robson, MD;  Location: [facility] ORS;  Service: Ophthalmology;  Laterality: Right;  [place] 01:04 AP%  16.9 CDE 10.96 Fluid pack lot # [id number]  . CATARACT EXTRACTION W/PHACO Left 11/11/2017   Procedure: CATARACT EXTRACTION PHACO AND INTRAOCULAR LENS PLACEMENT (IOC);  Surgeon: Birder Robson, MD;  Location: [facility] ORS;  Service: Ophthalmology;  Laterality: Left;  [place] 00:41 AP% 15.1 CDE 6.22 Fluid pack lot # [id number]  . JOINT REPLACEMENT     TOTAL KNEE  . total knee arthroplasty      Medications Prior to Admission medications   Medication Sig Start Date End Date Taking? Authorizing Provider  Calcium Carbonate (CALTRATE 600 PO) Take 600 mg by mouth daily.    [provider]  Difluprednate (DUREZOL) 0.05 % EMUL Apply 1 drop to eye 2 (two) times daily. Operative eye    [provider]  ferrous sulfate 325 (65 FE) MG tablet Take 325 mg by mouth daily with breakfast.    [provider]  levothyroxine (SYNTHROID, LEVOTHROID) 88 MCG tablet Take 88 mcg daily before breakfast by mouth.    [provider]  nepafenac (ILEVRO) 0.3 % ophthalmic suspension 1 drop daily. Into operative eye    [provider]  omeprazole (PRILOSEC) 20 MG capsule Take 20 mg daily by mouth.    [provider]  valsartan-hydrochlorothiazide (DIOVAN HCT) 80-12.5 MG tablet Take 1 tablet daily by mouth.    [provider]    Allergies Patient has no known allergies.  Family Hx History reviewed. No pertinent family history.  Social Hx Social History   Tobacco Use  . Smoking status:  Unknown If Ever Smoked  . Smokeless tobacco: Never Used  . Tobacco comment: tobacco use - no  Substance Use Topics  . Alcohol use: No  . Drug use: No     Review of Systems Unable to obtain due to AMS.   Physical Exam  Vital Signs: ED Triage Vitals  Enc Vitals Group     BP 11/20/19 1932 129/69     Pulse Rate 11/20/19 1932 67     Resp 11/20/19 1932 18     Temp 11/20/19 1932 99.2 F (37.3 C)     Temp Source 11/20/19 1932 Oral     SpO2 11/20/19 1932 99 %      Weight 11/20/19 1933 150 lb (68 kg)     Height 11/20/19 1933 5\' 5"  (1.651 m)     Head Circumference --      Peak Flow --      Pain Score --      Pain Loc --      Pain Edu? --      Excl. in [place]? --     Constitutional: Awake, alert, hallucinating, but pleasantly demented. Head: Normocephalic. Atraumatic. Eyes: Conjunctivae clear. Sclera anicteric. Nose: No congestion. No rhinorrhea. Mouth/Throat: Wearing mask.  Neck: No stridor.   Cardiovascular: Normal rate, regular rhythm. Extremities well perfused. Respiratory: Normal respiratory effort.  Lungs CTAB. Gastrointestinal: Soft. Non-tender. Non-distended.  Musculoskeletal: No lower extremity edema. No deformities. Neurologic:  Normal speech and language. Moves all extremities equally.  Psychiatric: Pleasantly demented. Seems to be hallucinating.   EKG  Personally reviewed.   Rate: 80s Rhythm: irregular Axis: LAD Intervals: QRS 129 ms PVC No STEMI    Radiology  CXR: IMPRESSION:  Enlargement of cardiac silhouette with subsegmental atelectasis LEFT  base.   CT head: IMPRESSION:  1. No acute intracranial or calvarial findings.  2. Lucent lesion in the left parietal bone, unlikely to be  clinically significant in this 85 year old, and possibly a  hemangioma.    Procedures  Procedure(s) performed (including critical care):  Procedures   Initial Impression / Assessment and Plan / ED Course  85 y.o. female who presents to the ED for AMS, confusion above baseline. As above.   Ddx: UTI vs other infection, electrolyte abnormality, dehydration  Will obtain labs, urine, imaging, reassess  Work-up consistent with UTI.  Leukocytosis to 14.7.  UA consistent with infection.  Creatinine 1.69, no priors to compare to but reportedly does have a history of CKD.  CT head negative.  Will plan for admission for fluids, antibiotics.  Discussed with hospitalist for admission.   Final Clinical Impression(s) / ED Diagnosis  Final  diagnoses:  Confusion  Urinary tract infection in elderly patient       Note:  This document was prepared using Dragon voice recognition software and may include unintentional dictation errors.   Lilia Pro., MD 11/21/19 Laureen Abrahams

## 2019-11-20 NOTE — ED Notes (Signed)
UA sent to lab at this time.

## 2019-11-20 NOTE — ED Triage Notes (Signed)
Patient arrives via EMS for apparent altered mental status. Report unclear as to why she is at the hospital. Called out this morning for a fall, "neighbors" didn't want her transferred at the time, no reports of family involvement. Patient lives alone and has dementia, someone checking on her reports auditory and visual hallucinations as well as foul smelling urine. Patient does not complain of pain

## 2019-11-20 NOTE — ED Notes (Signed)
Spoke with patient's son Marlou Sa who is the person who called 911 for the patient tonight. Reports increased confusion and that EMS told him this AM that her urine was foul smelling. Reports that patient lives alone but son lives next door. Patient is in [facility]

## 2019-11-21 ENCOUNTER — Observation Stay
Admission: EM | Admit: 2019-11-21 | Discharge: 2019-11-21 | Disposition: A | Discharge status: Home / Self Care | Payer: Medicare (Managed Care) | Source: Home / Self Care | Attending: Internal Medicine | Admitting: Internal Medicine

## 2019-11-21 ENCOUNTER — Inpatient Hospital Stay: Payer: Medicare (Managed Care)

## 2019-11-21 ENCOUNTER — Other Ambulatory Visit: Payer: Self-pay

## 2019-11-21 ENCOUNTER — Encounter: Payer: Self-pay | Admitting: Internal Medicine

## 2019-11-21 DIAGNOSIS — R6520 Severe sepsis without septic shock: Secondary | ICD-10-CM | POA: Diagnosis not present

## 2019-11-21 DIAGNOSIS — F0280 Dementia in other diseases classified elsewhere without behavioral disturbance: Secondary | ICD-10-CM | POA: Diagnosis present

## 2019-11-21 DIAGNOSIS — N183 Chronic kidney disease, stage 3 unspecified: Secondary | ICD-10-CM | POA: Diagnosis present

## 2019-11-21 DIAGNOSIS — N179 Acute kidney failure, unspecified: Secondary | ICD-10-CM | POA: Diagnosis present

## 2019-11-21 DIAGNOSIS — A419 Sepsis, unspecified organism: Secondary | ICD-10-CM | POA: Diagnosis not present

## 2019-11-21 DIAGNOSIS — R531 Weakness: Secondary | ICD-10-CM | POA: Diagnosis not present

## 2019-11-21 DIAGNOSIS — I472 Ventricular tachycardia: Secondary | ICD-10-CM | POA: Diagnosis present

## 2019-11-21 DIAGNOSIS — Z515 Encounter for palliative care: Secondary | ICD-10-CM | POA: Diagnosis not present

## 2019-11-21 DIAGNOSIS — E860 Dehydration: Secondary | ICD-10-CM | POA: Diagnosis present

## 2019-11-21 DIAGNOSIS — I517 Cardiomegaly: Secondary | ICD-10-CM | POA: Diagnosis present

## 2019-11-21 DIAGNOSIS — R410 Disorientation, unspecified: Secondary | ICD-10-CM | POA: Diagnosis present

## 2019-11-21 DIAGNOSIS — E039 Hypothyroidism, unspecified: Secondary | ICD-10-CM | POA: Diagnosis present

## 2019-11-21 DIAGNOSIS — N390 Urinary tract infection, site not specified: Secondary | ICD-10-CM | POA: Diagnosis present

## 2019-11-21 DIAGNOSIS — G9341 Metabolic encephalopathy: Secondary | ICD-10-CM | POA: Diagnosis present

## 2019-11-21 DIAGNOSIS — A4151 Sepsis due to Escherichia coli [E. coli]: Secondary | ICD-10-CM | POA: Diagnosis present

## 2019-11-21 DIAGNOSIS — J449 Chronic obstructive pulmonary disease, unspecified: Secondary | ICD-10-CM | POA: Diagnosis present

## 2019-11-21 DIAGNOSIS — Z66 Do not resuscitate: Secondary | ICD-10-CM | POA: Diagnosis present

## 2019-11-21 DIAGNOSIS — I129 Hypertensive chronic kidney disease with stage 1 through stage 4 chronic kidney disease, or unspecified chronic kidney disease: Secondary | ICD-10-CM | POA: Diagnosis present

## 2019-11-21 DIAGNOSIS — R627 Adult failure to thrive: Secondary | ICD-10-CM | POA: Diagnosis not present

## 2019-11-21 DIAGNOSIS — I248 Other forms of acute ischemic heart disease: Secondary | ICD-10-CM | POA: Diagnosis present

## 2019-11-21 DIAGNOSIS — M6282 Rhabdomyolysis: Secondary | ICD-10-CM | POA: Diagnosis present

## 2019-11-21 DIAGNOSIS — G20 Parkinson's disease: Secondary | ICD-10-CM | POA: Diagnosis present

## 2019-11-21 DIAGNOSIS — Z20828 Contact with and (suspected) exposure to other viral communicable diseases: Secondary | ICD-10-CM | POA: Diagnosis present

## 2019-11-21 LAB — SARS CORONAVIRUS 2 (TAT 6-24 HRS): SARS Coronavirus 2: NEGATIVE

## 2019-11-21 LAB — CBC
HCT: 35.6 % — ABNORMAL LOW (ref 36.0–46.0)
Hemoglobin: 11.7 g/dL — ABNORMAL LOW (ref 12.0–15.0)
MCH: 30.2 pg (ref 26.0–34.0)
MCHC: 32.9 g/dL (ref 30.0–36.0)
MCV: 91.8 fL (ref 80.0–100.0)
Platelets: 159 10*3/uL (ref 150–400)
RBC: 3.88 MIL/uL (ref 3.87–5.11)
RDW: 13.2 % (ref 11.5–15.5)
WBC: 13.6 10*3/uL — ABNORMAL HIGH (ref 4.0–10.5)
nRBC: 0 % (ref 0.0–0.2)

## 2019-11-21 LAB — COMPREHENSIVE METABOLIC PANEL
ALT: 15 U/L (ref 0–44)
AST: 50 U/L — ABNORMAL HIGH (ref 15–41)
Albumin: 3.6 g/dL (ref 3.5–5.0)
Alkaline Phosphatase: 32 U/L — ABNORMAL LOW (ref 38–126)
Anion gap: 13 (ref 5–15)
BUN: 41 mg/dL — ABNORMAL HIGH (ref 8–23)
CO2: 22 mmol/L (ref 22–32)
Calcium: 8.5 mg/dL — ABNORMAL LOW (ref 8.9–10.3)
Chloride: 110 mmol/L (ref 98–111)
Creatinine, Ser: 1.64 mg/dL — ABNORMAL HIGH (ref 0.44–1.00)
GFR calc Af Amer: 31 mL/min — ABNORMAL LOW (ref >60)
GFR calc non Af Amer: 27 mL/min — ABNORMAL LOW (ref >60)
Glucose, Bld: 95 mg/dL (ref 70–99)
Potassium: 3.8 mmol/L (ref 3.5–5.1)
Sodium: 145 mmol/L (ref 135–145)
Total Bilirubin: 0.9 mg/dL (ref 0.3–1.2)
Total Protein: 6.1 g/dL — ABNORMAL LOW (ref 6.5–8.1)

## 2019-11-21 LAB — TROPONIN I (HIGH SENSITIVITY)
Troponin I (High Sensitivity): 226 ng/L (ref <18)
Troponin I (High Sensitivity): 250 ng/L (ref <18)
Troponin I (High Sensitivity): 238 ng/L (ref <18)

## 2019-11-21 LAB — TSH: TSH: 2.766 u[IU]/mL (ref 0.350–4.500)

## 2019-11-21 LAB — MAGNESIUM: Magnesium: 1.5 mg/dL — ABNORMAL LOW (ref 1.7–2.4)

## 2019-11-21 LAB — LACTIC ACID, PLASMA: Lactic Acid, Venous: 1.4 mmol/L (ref 0.5–1.9)

## 2019-11-21 LAB — FIBRIN DERIVATIVES D-DIMER (ARMC ONLY): Fibrin derivatives D-dimer (ARMC): 1553.79 ng/mL (FEU) — ABNORMAL HIGH (ref 0.00–499.00)

## 2019-11-21 LAB — PHOSPHORUS: Phosphorus: 3.3 mg/dL (ref 2.5–4.6)

## 2019-11-21 MED ORDER — MAGNESIUM SULFATE 2 GM/50ML IV SOLN
2.0000 g | Freq: Once | INTRAVENOUS | Status: AC
  Administered 2019-11-21: 2 g via INTRAVENOUS
  Filled 2019-11-21: qty 50

## 2019-11-21 MED ORDER — SODIUM CHLORIDE 0.9 % IV SOLN: INTRAVENOUS | Status: DC

## 2019-11-21 MED ORDER — SODIUM CHLORIDE 0.9 % IV BOLUS
250.0000 mL | Freq: Once | INTRAVENOUS | Status: AC
  Administered 2019-11-21: 250 mL via INTRAVENOUS

## 2019-11-21 MED ORDER — METOPROLOL TARTRATE 25 MG PO TABS
25.0000 mg | ORAL_TABLET | Freq: Two times a day (BID) | ORAL | Status: DC
  Administered 2019-11-21 – 2019-11-22 (×2): 25 mg via ORAL
  Filled 2019-11-21 (×2): qty 1

## 2019-11-21 MED ORDER — METOPROLOL TARTRATE 5 MG/5ML IV SOLN
5.0000 mg | Freq: Once | INTRAVENOUS | Status: AC
  Administered 2019-11-21: 5 mg via INTRAVENOUS
  Filled 2019-11-21: qty 5

## 2019-11-21 NOTE — Consult Note (Signed)
CARDIOLOGY CONSULT NOTE               Patient ID: Dana Hudson MRN: [id number] DOB/AGE: 1923-12-10 85 y.o.  Admit date: 11/20/2019 Referring Physician Dr Roel Cluck hospitalist Primary Physician Dr Caryl Comes  Primary Cardiologist Unknown Reason for Consultation nonsustained VT  HPI: Patient is a 85 year old female presents after a fall altered mental status encephalopathy possible dementia hypertension hyperlipidemia previous SVT history of syncope COPD presented with altered mental status after a fall patient was thought to possibly have a urinary tract infection while on telemetry she had borderline troponins and then episodes of nonsustained VT appeared to be asymptomatic so cardiology was then consulted no significant fever chills or sweats previous history of SVT status post ablation in the past  Review of systems complete and found to be negative unless listed above     Past Medical History:  Diagnosis Date  . Anemia   . Arthritis   . Cancer ([place])    SKIN  . Chronic kidney disease    STAGE 3  . Dementia ([place])   . Dementia ([place])   . Dysrhythmia   . Edema   . GERD (gastroesophageal reflux disease)   . HOH (hard of hearing)    AIDS  . Hypothyroidism   . Palpitations   . Parkinson disease ([place])   . Paroxysmal supraventricular tachycardia [facility])    SVT/PSVT/PAT    Past Surgical History:  Procedure Laterality Date  . ABDOMINAL HYSTERECTOMY    . CARDIAC ELECTROPHYSIOLOGY STUDY AND ABLATION    . CATARACT EXTRACTION W/PHACO Right 10/14/2017   Procedure: CATARACT EXTRACTION PHACO AND INTRAOCULAR LENS PLACEMENT (IOC);  Surgeon: Birder Robson, MD;  Location: [facility] ORS;  Service: Ophthalmology;  Laterality: Right;  [place] 01:04 AP% 16.9 CDE 10.96 Fluid pack lot # [id number]  . CATARACT EXTRACTION W/PHACO Left 11/11/2017   Procedure: CATARACT EXTRACTION PHACO AND INTRAOCULAR LENS PLACEMENT (IOC);  Surgeon: Birder Robson, MD;  Location: [facility] ORS;  Service: Ophthalmology;   Laterality: Left;  [place] 00:41 AP% 15.1 CDE 6.22 Fluid pack lot # [id number]  . JOINT REPLACEMENT     TOTAL KNEE  . total knee arthroplasty      Medications Prior to Admission  Medication Sig Dispense Refill Last Dose  . Calcium Carbonate-Vitamin D 600-400 MG-UNIT tablet Take 1 tablet by mouth.   unknown at unknown  . ferrous sulfate 325 (65 FE) MG tablet Take 325 mg by mouth daily with breakfast.   unknown at unknown  . levothyroxine (SYNTHROID, LEVOTHROID) 88 MCG tablet Take 88 mcg daily before breakfast by mouth.   unknown at unknown  . omeprazole (PRILOSEC) 20 MG capsule Take 20 mg daily by mouth.   unknown at unknown  . valsartan-hydrochlorothiazide (DIOVAN HCT) 80-12.5 MG tablet Take 1 tablet daily by mouth.   unknown at unknown  . Calcium Carbonate (CALTRATE 600 PO) Take 600 mg by mouth daily.   Not Taking at Unknown time   Social History   Socioeconomic History  . Marital status: Widowed    Spouse name: Not on file  . Number of children: Not on file  . Years of education: Not on file  . Highest education level: Not on file  Occupational History  . Not on file  Tobacco Use  . Smoking status: Unknown If Ever Smoked  . Smokeless tobacco: Never Used  . Tobacco comment: tobacco use - no  Substance and Sexual Activity  . Alcohol use: No  . Drug use: No  . Sexual  activity: Not on file  Other Topics Concern  . Not on file  Social History Narrative   Retired, widowed, does not get regular exercise.    Social Determinants of Health   Financial Resource Strain:   . Difficulty of Paying Living Expenses: Not on file  Food Insecurity:   . Worried About Charity fundraiser in the Last Year: Not on file  . Ran Out of Food in the Last Year: Not on file  Transportation Needs:   . Lack of Transportation (Medical): Not on file  . Lack of Transportation (Non-Medical): Not on file  Physical Activity:   . Days of Exercise per Week: Not on file  . Minutes of Exercise per Session: Not  on file  Stress:   . Feeling of Stress : Not on file  Social Connections:   . Frequency of Communication with Friends and Family: Not on file  . Frequency of Social Gatherings with Friends and Family: Not on file  . Attends Religious Services: Not on file  . Active Member of Clubs or Organizations: Not on file  . Attends Archivist Meetings: Not on file  . Marital Status: Not on file  Intimate Partner Violence:   . Fear of Current or Ex-Partner: Not on file  . Emotionally Abused: Not on file  . Physically Abused: Not on file  . Sexually Abused: Not on file    Family History  Problem Relation Age of Onset  . Sudden Cardiac Death Mother   . Leukemia Brother   . Hypertension Neg Hx   . Diabetes Neg Hx   . Stroke Neg Hx       Review of systems complete and found to be negative unless listed above      PHYSICAL EXAM  General: Well developed, well nourished, in no acute distress HEENT:  Normocephalic and atramatic Neck:  No JVD.  Lungs: Clear bilaterally to auscultation and percussion. Heart: HRRR . Normal S1 and S2 without gallops or murmurs.  Abdomen: Bowel sounds are positive, abdomen soft and non-tender  Msk:  Back normal, normal gait. Normal strength and tone for age. Extremities: No clubbing, cyanosis or edema.   Neuro: Alert and oriented X 3. Psych:  Good affect, responds appropriately  Labs:   Lab Results  Component Value Date   WBC 13.6 (H) 11/21/2019   HGB 11.7 (L) 11/21/2019   HCT 35.6 (L) 11/21/2019   MCV 91.8 11/21/2019   PLT 159 11/21/2019    Recent Labs  Lab 11/21/19 0518  NA 145  K 3.8  CL 110  CO2 22  BUN 41*  CREATININE 1.64*  CALCIUM 8.5*  PROT 6.1*  BILITOT 0.9  ALKPHOS 32*  ALT 15  AST 50*  GLUCOSE 95   Lab Results  Component Value Date   CKTOTAL 1,183 (H) 11/20/2019   No results found for: CHOL No results found for: HDL No results found for: LDLCALC No results found for: TRIG No results found for: CHOLHDL No  results found for: LDLDIRECT    Radiology: CT Head Wo Contrast  Result Date: 11/20/2019 CLINICAL DATA:  Altered mental status. Possible fall. History of dementia. No given history of malignancy. EXAM: CT HEAD WITHOUT CONTRAST TECHNIQUE: Contiguous axial images were obtained from the base of the skull through the vertex without intravenous contrast. COMPARISON:  Report only from PET-CT 01/14/2003. Whole-body bone scan 04/08/2007. FINDINGS: Brain: There is no evidence of acute intracranial hemorrhage, mass lesion, brain edema or extra-axial fluid collection.  There is atrophy with prominence of the ventricles and subarachnoid spaces. Minimal chronic small vessel ischemic changes in the periventricular white matter. There is no CT evidence of acute cortical infarction. Vascular: Mild intracranial vascular calcifications. No hyperdense vessel identified. Skull: There is a 1.7 cm lucent lesion in the left parietal bone on image 59/2. This appears nonaggressive and may reflect a hemangioma. No acute osseous findings. Sinuses/Orbits: The visualized paranasal sinuses and mastoid air cells are clear. Previous bilateral lens surgery. Other: Right TMJ osteoarthritis. IMPRESSION: 1. No acute intracranial or calvarial findings. 2. Lucent lesion in the left parietal bone, unlikely to be clinically significant in this 85 year old, and possibly a hemangioma. Electronically Signed   By: Richardean Sale M.D.   On: 11/20/2019 20:49   MR BRAIN WO CONTRAST  Result Date: 11/21/2019 CLINICAL DATA:  Ataxia, stroke. Encephalopathy. Personal history of dementia with acute onset of increased confusion. EXAM: MRI HEAD WITHOUT CONTRAST TECHNIQUE: Multiplanar, multiecho pulse sequences of the brain and surrounding structures were obtained without intravenous contrast. COMPARISON:  CT head without contrast 11/20/2019. FINDINGS: Brain: Mild atrophy and white matter changes are within normal limits for age. The ventricles are of normal  size. No significant extraaxial fluid collection is present. No acute infarct, hemorrhage, or mass lesion is present. The internal auditory canals are within normal limits. The brainstem and cerebellum are within normal limits. Vascular: Flow is present in the major intracranial arteries. Skull and upper cervical spine: T2 hyperintense lesion in the right parotid gland measures 10 mm none other focal soft tissue lesions are present. Parietal skull lesion demonstrates T1 shortening, likely hemangioma. Craniocervical junction is normal. Mild degenerative changes are present in the upper cervical spine. Sinuses/Orbits: The paranasal sinuses and mastoid air cells are clear. Bilateral lens replacements are noted. Globes and orbits are otherwise unremarkable. IMPRESSION: 1. Normal MRI appearance of the brain for age. No acute or focal lesion to explain the patient's symptoms. 2. 10 mm cystic lesion in the right parotid gland, likely benign. No follow-up recommended. Electronically Signed   By: San Morelle M.D.   On: 11/21/2019 18:41   US Carotid Bilateral  Result Date: 11/21/2019 CLINICAL DATA:  85 year old female with syncope EXAM: BILATERAL CAROTID DUPLEX ULTRASOUND TECHNIQUE: Pearline Cables scale imaging, color Doppler and duplex ultrasound were performed of bilateral carotid and vertebral arteries in the neck. COMPARISON:  None. FINDINGS: Criteria: Quantification of carotid stenosis is based on velocity parameters that correlate the residual internal carotid diameter with NASCET-based stenosis levels, using the diameter of the distal internal carotid lumen as the denominator for stenosis measurement. The following velocity measurements were obtained: RIGHT ICA: 95/18 cm/sec CCA: [id number] cm/sec SYSTOLIC ICA/CCA RATIO:  1.2 ECA:  110 cm/sec LEFT ICA: 95/23 cm/sec CCA: [id number] cm/sec SYSTOLIC ICA/CCA RATIO:  1.2 ECA:  150 cm/sec RIGHT CAROTID ARTERY: Mild heterogeneous atherosclerotic plaque in the proximal internal  carotid artery. By peak systolic velocity criteria, the estimated stenosis is less than 50%. RIGHT VERTEBRAL ARTERY:  Patent with normal antegrade flow. LEFT CAROTID ARTERY: Trace heterogeneous atherosclerotic plaque in the proximal internal carotid artery. By peak systolic velocity criteria, the estimated stenosis remains less than 50%. LEFT VERTEBRAL ARTERY:  Patent with normal antegrade flow. IMPRESSION: 1. Mild (1-49%) stenosis proximal right internal carotid artery secondary to mild heterogeneous atherosclerotic plaque. 2. Mild (1-49%) stenosis proximal left internal carotid artery secondary to trace heterogeneous atherosclerotic plaque. 3. Vertebral arteries are patent with normal antegrade flow. Signed, Criselda Peaches, MD, [facility]  [facility] Radiology Electronically Signed   By: Jacqulynn Cadet M.D.   On: 11/21/2019 15:17   DG Chest Port 1 View  Result Date: 11/20/2019 CLINICAL DATA:  Increased confusion, foul smelling urine, history dementia, stage III chronic kidney disease, Parkinson's, GERD EXAM: PORTABLE CHEST 1 VIEW COMPARISON:  Portable exam 1936 hours compared to 06/13/2007 FINDINGS: Enlargement of cardiac silhouette. Mediastinal contours and pulmonary vascularity normal. Atherosclerotic calcification aorta. Subsegmental atelectasis LEFT base. Lungs otherwise clear. No pulmonary infiltrate, pleural effusion or pneumothorax. Bones demineralized. IMPRESSION: Enlargement of cardiac silhouette with subsegmental atelectasis LEFT base. Electronically Signed   By: [name]   On: 11/20/2019 20:04    EKG: Normal sinus rhythm multiple PVCs episodes of nonsustained VT Short run nonspecific ST-T wave changes  ASSESSMENT AND PLAN:  Nonsustained VT Acute metabolic encephalopathy Altered mental status Status post fall Dementia Hypertension Chronic renal insufficiency stage III Hyperlipidemia History of  SVT COPD Sepsis Rhabdo Syncope Dehydration UTI Borderline troponin . Plan Agree with telemetry Agree with echocardiogram for further assessment Will consider restarting metoprolol beta-blocker therapy to help with palpitations and nonsustained VT Recommend correct electrolytes Recommend gentle hydration Consider continuing antibiotic therapy for UTI Recommend conservative medical therapy at this point Follow-up EKGs and troponins  Signed: Yolonda Kida MD 11/21/2019, 10:19 PM

## 2019-11-21 NOTE — Progress Notes (Signed)
[facility] NAME: Dana Hudson    MR#:  [id number]  DATE OF BIRTH:  [birth year]  SUBJECTIVE:  CHIEF COMPLAINT:   Chief Complaint  Patient presents with  . Altered Mental Status  . Fall  staring at me but doesn't communicate REVIEW OF SYSTEMS:  ROSnot able to obtain due to mental status DRUG ALLERGIES:  No Known Allergies VITALS:  Blood pressure 140/77, pulse 73, temperature 98.9 F (37.2 C), temperature source Oral, resp. rate 19, height 5\' 5"  (1.651 m), weight 70.6 kg, SpO2 99 %. PHYSICAL EXAMINATION:  Physical Exam Constitutional:      Appearance: She is underweight.  HENT:     Head: Normocephalic and atraumatic.  Eyes:     Conjunctiva/sclera: Conjunctivae normal.     Pupils: Pupils are equal, round, and reactive to light.  Neck:     Thyroid: No thyromegaly.     Trachea: No tracheal deviation.  Cardiovascular:     Rate and Rhythm: Normal rate and regular rhythm.     Heart sounds: Normal heart sounds.  Pulmonary:     Effort: Pulmonary effort is normal. No respiratory distress.     Breath sounds: Normal breath sounds. No wheezing.  Chest:     Chest wall: No tenderness.  Abdominal:     General: Bowel sounds are normal. There is no distension.     Palpations: Abdomen is soft.     Tenderness: There is no abdominal tenderness.  Musculoskeletal:        General: Normal range of motion.     Cervical back: Normal range of motion and neck supple.  Skin:    General: Skin is warm and dry.     Findings: No rash.  Neurological:     Mental Status: She is alert. She is confused.     Cranial Nerves: No cranial nerve deficit.     Comments: Doesn't communicate, has blank stare    LABORATORY PANEL:  Female CBC Recent Labs  Lab 11/21/19 0518  WBC 13.6*  HGB 11.7*  HCT 35.6*  PLT 159   ------------------------------------------------------------------------------------------------------------------ Chemistries  Recent Labs  Lab  11/21/19 0518  NA 145  K 3.8  CL 110  CO2 22  GLUCOSE 95  BUN 41*  CREATININE 1.64*  CALCIUM 8.5*  MG 1.5*  AST 50*  ALT 15  ALKPHOS 32*  BILITOT 0.9   RADIOLOGY:  CT Head Wo Contrast  Result Date: 11/20/2019 CLINICAL DATA:  Altered mental status. Possible fall. History of dementia. No given history of malignancy. EXAM: CT HEAD WITHOUT CONTRAST TECHNIQUE: Contiguous axial images were obtained from the base of the skull through the vertex without intravenous contrast. COMPARISON:  Report only from PET-CT 01/14/2003. Whole-body bone scan 04/08/2007. FINDINGS: Brain: There is no evidence of acute intracranial hemorrhage, mass lesion, brain edema or extra-axial fluid collection. There is atrophy with prominence of the ventricles and subarachnoid spaces. Minimal chronic small vessel ischemic changes in the periventricular white matter. There is no CT evidence of acute cortical infarction. Vascular: Mild intracranial vascular calcifications. No hyperdense vessel identified. Skull: There is a 1.7 cm lucent lesion in the left parietal bone on image 59/2. This appears nonaggressive and may reflect a hemangioma. No acute osseous findings. Sinuses/Orbits: The visualized paranasal sinuses and mastoid air cells are clear. Previous bilateral lens surgery. Other: Right TMJ osteoarthritis. IMPRESSION: 1. No acute intracranial or calvarial findings. 2. Lucent lesion in the left parietal bone, unlikely to be clinically significant in this  85 year old, and possibly a hemangioma. Electronically Signed   By: Richardean Sale M.D.   On: 11/20/2019 20:49   US Carotid Bilateral  Result Date: 11/21/2019 CLINICAL DATA:  85 year old female with syncope EXAM: BILATERAL CAROTID DUPLEX ULTRASOUND TECHNIQUE: Pearline Cables scale imaging, color Doppler and duplex ultrasound were performed of bilateral carotid and vertebral arteries in the neck. COMPARISON:  None. FINDINGS: Criteria: Quantification of carotid stenosis is based on  velocity parameters that correlate the residual internal carotid diameter with NASCET-based stenosis levels, using the diameter of the distal internal carotid lumen as the denominator for stenosis measurement. The following velocity measurements were obtained: RIGHT ICA: 95/18 cm/sec CCA: [id number] cm/sec SYSTOLIC ICA/CCA RATIO:  1.2 ECA:  110 cm/sec LEFT ICA: 95/23 cm/sec CCA: [id number] cm/sec SYSTOLIC ICA/CCA RATIO:  1.2 ECA:  150 cm/sec RIGHT CAROTID ARTERY: Mild heterogeneous atherosclerotic plaque in the proximal internal carotid artery. By peak systolic velocity criteria, the estimated stenosis is less than 50%. RIGHT VERTEBRAL ARTERY:  Patent with normal antegrade flow. LEFT CAROTID ARTERY: Trace heterogeneous atherosclerotic plaque in the proximal internal carotid artery. By peak systolic velocity criteria, the estimated stenosis remains less than 50%. LEFT VERTEBRAL ARTERY:  Patent with normal antegrade flow. IMPRESSION: 1. Mild (1-49%) stenosis proximal right internal carotid artery secondary to mild heterogeneous atherosclerotic plaque. 2. Mild (1-49%) stenosis proximal left internal carotid artery secondary to trace heterogeneous atherosclerotic plaque. 3. Vertebral arteries are patent with normal antegrade flow. Signed, Criselda Peaches, MD, [facility] Radiology Electronically Signed   By: Jacqulynn Cadet M.D.   On: 11/21/2019 15:17   DG Chest Port 1 View  Result Date: 11/20/2019 CLINICAL DATA:  Increased confusion, foul smelling urine, history dementia, stage III chronic kidney disease, Parkinson's, GERD EXAM: PORTABLE CHEST 1 VIEW COMPARISON:  Portable exam 1936 hours compared to 06/13/2007 FINDINGS: Enlargement of cardiac silhouette. Mediastinal contours and pulmonary vascularity normal. Atherosclerotic calcification aorta. Subsegmental atelectasis LEFT base. Lungs otherwise clear. No pulmonary infiltrate, pleural effusion or pneumothorax.  Bones demineralized. IMPRESSION: Enlargement of cardiac silhouette with subsegmental atelectasis LEFT base. Electronically Signed   By: [name]   On: 11/20/2019 20:04   ASSESSMENT AND PLAN:  85 y.o. female with medical history significant of dementia, HTN, CKD stage III, hypothyroidism, HLD, Anemia, Dysphagia, SVT, h/o of syncope, COPD, Benign essential tremor, Thrombocytopenia  Admitted for Sepsis, syncope  Present on Admission: Acute metabolic encephalopathy -   - most likely multifactorial secondary to combination of  infection  dehydration secondary to decreased by mouth intake    - continue IVFs   - treat underlining infection  - MRI of the brain to r/o acute patho  - neurological exam appears to be nonfocal but patient unable to cooperate fully    . Sepsis ([place]) - present on admission due to UTI  - continue IV rocephin   . Dehydration - continue IVFs  * Rhabdomyolysis: present on admission - monitor CK, continue IVFs  . Syncope -  carotid dopplers showing no significant stenosis, echo pending  . Acute lower UTI -  - continue Rocephin        await results of urine culture and adjust antibiotic coverage as needed  . Cardiomegaly - echo pending  * Nonsustained V-Tach - all day, also some ventricular bigeminy She had hx of A.fib per family and had ablation per epic it was SVT Pt is at high risk for falls would not be candidate for  anticoagulation Pending echo & cardio c/s -  Keep close to 4 and Mg > 2  Elevated trop -due to demand ischemia   Overall very poor prognosis, high risk for cardio resp failure and death.  D/w her son - he's agreeable with keeping her comfortable. Palliative care c/s for tomorrow.  Hospice may be appropriate    All the records are reviewed and case discussed with Care Management/Social Worker. Management plans discussed with the patient, family (d/w son and daughter in law over phone) and they are in agreement.  CODE STATUS:  DNR  TOTAL TIME TAKING CARE OF THIS PATIENT: 35 minutes.   More than 50% of the time was spent in counseling/coordination of care: YES  POSSIBLE D/C IN 1-2 DAYS, DEPENDING ON CLINICAL CONDITION.   Max Sane M.D on 11/21/2019 at 4:57 PM  Between 7am to 6pm - Pager - [phone]  After 6pm go to [email or web address] - password [email or web address]  [facility]   CC: Primary care physician; System, Pcp Not In  Note: This dictation was prepared with Dragon dictation along with smaller phrase technology. Any transcriptional errors that result from this process are unintentional.

## 2019-11-21 NOTE — Evaluation (Signed)
Occupational Therapy Evaluation Patient Details Name: Dana Hudson MRN: [id number] DOB: 1924-09-06 Today's Date: 11/21/2019    History of Present Illness Dana Hudson is a 85 y.o. female with medical history significant of dementia, HTN, CKD stage III, hypothyroidism, HLD, Anemia, Dysphagia, SVT, h/o of syncope, COPD, Benign essential tremor, Thrombocytopenia   Clinical Impression   Patient seen for OT evaluation this date.  Patient is confused and has no family available to provide information regarding her past level of independence.  Her chart indicates she lives in an apartment with her son next door.  Patient refers to this during the evaluation and states her son's name is Lanny Hurst.  She had difficulty attending to tasks and difficulty with redirection to activity.  She currently requires max assist with all self care and transfers.  She presents with muscle weakness, confusion, decreased transfers, decreased mobility, and decreased ability to perform self care tasks.  She would benefit from skilled OT services to maximize safety and independence in daily tasks.  She will likely require SNF at discharge and would not be safe to live alone with her current abilities.      Follow Up Recommendations  SNF    Equipment Recommendations       Recommendations for Other Services       Precautions / Restrictions Precautions Precautions: Fall      Mobility Bed Mobility Overal bed mobility: Needs Assistance Bed Mobility: Supine to Sit;Sit to Supine     Supine to sit: Max assist Sit to supine: Max assist   General bed mobility comments: Max assist for transfers and for repositioning in bed this date.  Transfers                 General transfer comment: Did not perform sit to stand transfer and may require the use of +2.    Balance                                           ADL either performed or assessed with clinical judgement   ADL Overall ADL's :  Needs assistance/impaired     Grooming: Set up;Minimal assistance   Upper Body Bathing: Maximal assistance   Lower Body Bathing: Maximal assistance   Upper Body Dressing : Maximal assistance   Lower Body Dressing: Maximal assistance     Toilet Transfer Details (indicate cue type and reason): Did not perform this date.           General ADL Comments: Patient emotionally labile at times, some perservation noted and difficult to distract from current thoughts. She required max assist for bed mobility and supine to sit EOB.  Confusion limited her participation in self care tasks.     Vision Baseline Vision/History: Wears glasses Additional Comments: unable to determine any changes in vision currently.     Perception     Praxis      Pertinent Vitals/Pain       Hand Dominance Right   Extremity/Trunk Assessment Upper Extremity Assessment Upper Extremity Assessment: Generalized weakness   Lower Extremity Assessment Lower Extremity Assessment: Defer to PT evaluation       Communication Communication Communication: No difficulties   Cognition Arousal/Alertness: Awake/alert Behavior During Therapy: WFL for tasks assessed/performed Overall Cognitive Status: Within Functional Limits for tasks assessed  General Comments: Patient confused, states, "Things didn't go well this morning, people were coming in saying "merry christmas" and trying to sell things to me and Lanny Hurst."  Patient tearful when talking.  She believes she is at her house and thinks her son is outside the door.   General Comments       Exercises     Shoulder Instructions      Home Living Family/patient expects to be discharged to:: Private residence Living Arrangements: Alone Available Help at Discharge: Family Type of Home: Apartment       Home Layout: One level                   Additional Comments: Patient was confused and unable to  accurately give information, chart indicates she lives next door to her son in an apartment and has assistance with self care tasks.      Prior Functioning/Environment Level of Independence: Needs assistance        Comments: It is unclear what the patients prior level of function is.        OT Problem List: Decreased strength;Decreased knowledge of use of DME or AE;Decreased activity tolerance;Decreased cognition;Impaired balance (sitting and/or standing);Decreased safety awareness      OT Treatment/Interventions: Self-care/ADL training;Therapeutic exercise;Patient/family education;Neuromuscular education;Therapeutic activities;DME and/or AE instruction;Cognitive remediation/compensation    OT Goals(Current goals can be found in the care plan section) Acute Rehab OT Goals Patient Stated Goal: to go back home, be with her son. OT Goal Formulation: With patient Time For Goal Achievement: 12/04/19 Potential to Achieve Goals: Fair ADL Goals Pt Will Perform Grooming: (P) with set-up Pt Will Perform Upper Body Dressing: (P) with set-up;sitting Pt Will Transfer to Toilet: (P) with min assist  OT Frequency: Min 1X/week   Barriers to D/C:            Co-evaluation              AM-PAC OT "6 Clicks" Daily Activity     Outcome Measure Help from another person eating meals?: A Lot Help from another person taking care of personal grooming?: A Lot Help from another person toileting, which includes using toliet, bedpan, or urinal?: A Lot Help from another person bathing (including washing, rinsing, drying)?: A Lot Help from another person to put on and taking off regular upper body clothing?: A Lot Help from another person to put on and taking off regular lower body clothing?: A Lot 6 Click Score: 12   End of Session    Activity Tolerance: Patient limited by lethargy Patient left: in bed;with call bell/phone within reach;with bed alarm set  OT Visit Diagnosis: Muscle weakness  (generalized) (M62.81);Other symptoms and signs involving cognitive function                Time: 1301-1320 OT Time Calculation (min): 19 min Charges:  OT General Charges $OT Visit: 1 Visit OT Evaluation $OT Eval Low Complexity: 1 Low  Decarlos Empey T Laury Huizar, OTR/L, CLT   Zakeria Kulzer 11/21/2019, 2:07 PM

## 2019-11-21 NOTE — Progress Notes (Signed)
Patient with multiple runs of nonsustained V-Tach throughout shift and recently some ventricular bigeminy. Will inform doctor. Cardiology already consulted. Will continue to monitor. Wenda Low [facility]

## 2019-11-21 NOTE — ED Notes (Signed)
Date and time results received: 11/21/19 0125 (use smartphrase ".now" to insert current time)  Test: Troponin, fibrin Critical Value: 279, 1553  Name of Provider Notified: Rufina Falco, NP  Orders Received? Or Actions Taken?: Trend troponin and CK

## 2019-11-21 NOTE — Progress Notes (Signed)
Pt had 13 beat run vtach. Pt asymptomatic, no concerns offered. NP made aware, no new orders.

## 2019-11-21 NOTE — ED Notes (Signed)
Pt is oriented to self only and presents with incoherent speech

## 2019-11-21 NOTE — ED Notes (Signed)
Pt will not leave pox in place.

## 2019-11-21 NOTE — ED Notes (Signed)
Pt has bilateral hearing aids in place and is wearing her nightgown and her glasses.

## 2019-11-21 NOTE — Plan of Care (Signed)
  Problem: Education: Goal: Knowledge of General Education information will improve Description: Including pain rating scale, medication(s)/side effects and non-pharmacologic comfort measures Outcome: Not Progressing Note: Patient with severe dementia and UTI. Palliative care consulted. Will continue to monitor neurological status for the remainder of the shift. Wenda Low [facility]

## 2019-11-21 NOTE — Progress Notes (Signed)
*  PRELIMINARY RESULTS* Echocardiogram 2D Echocardiogram has been performed.  Kady Toothaker C Dayvian Blixt 11/21/2019, 1:10 PM

## 2019-11-21 NOTE — Progress Notes (Signed)
PT Cancellation Note  Patient Details Name: Dana Hudson MRN: [id number] DOB: 04/29/1924   Cancelled Treatment:    Reason Eval/Treat Not Completed: Medical issues which prohibited therapy.   Waiting for PE to be cleared, and will reattempt then.   Ramond Dial 11/21/2019, 1:38 PM   Mee Hives, PT MS Acute Rehab Dept. Number: [place] and [place]

## 2019-11-22 ENCOUNTER — Encounter: Payer: Self-pay | Admitting: Internal Medicine

## 2019-11-22 ENCOUNTER — Other Ambulatory Visit: Payer: Self-pay

## 2019-11-22 DIAGNOSIS — Z515 Encounter for palliative care: Secondary | ICD-10-CM

## 2019-11-22 DIAGNOSIS — R410 Disorientation, unspecified: Secondary | ICD-10-CM

## 2019-11-22 DIAGNOSIS — Z66 Do not resuscitate: Secondary | ICD-10-CM

## 2019-11-22 LAB — COMPREHENSIVE METABOLIC PANEL
ALT: 18 U/L (ref 0–44)
AST: 50 U/L — ABNORMAL HIGH (ref 15–41)
Albumin: 3.5 g/dL (ref 3.5–5.0)
Alkaline Phosphatase: 30 U/L — ABNORMAL LOW (ref 38–126)
Anion gap: 12 (ref 5–15)
BUN: 35 mg/dL — ABNORMAL HIGH (ref 8–23)
CO2: 20 mmol/L — ABNORMAL LOW (ref 22–32)
Calcium: 8.4 mg/dL — ABNORMAL LOW (ref 8.9–10.3)
Chloride: 112 mmol/L — ABNORMAL HIGH (ref 98–111)
Creatinine, Ser: 1.34 mg/dL — ABNORMAL HIGH (ref 0.44–1.00)
GFR calc Af Amer: 39 mL/min — ABNORMAL LOW (ref >60)
GFR calc non Af Amer: 34 mL/min — ABNORMAL LOW (ref >60)
Glucose, Bld: 97 mg/dL (ref 70–99)
Potassium: 3.5 mmol/L (ref 3.5–5.1)
Sodium: 144 mmol/L (ref 135–145)
Total Bilirubin: 1.1 mg/dL (ref 0.3–1.2)
Total Protein: 6.3 g/dL — ABNORMAL LOW (ref 6.5–8.1)

## 2019-11-22 LAB — CBC
HCT: 35.7 % — ABNORMAL LOW (ref 36.0–46.0)
Hemoglobin: 11.3 g/dL — ABNORMAL LOW (ref 12.0–15.0)
MCH: 30.5 pg (ref 26.0–34.0)
MCHC: 31.7 g/dL (ref 30.0–36.0)
MCV: 96.5 fL (ref 80.0–100.0)
Platelets: 168 10*3/uL (ref 150–400)
RBC: 3.7 MIL/uL — ABNORMAL LOW (ref 3.87–5.11)
RDW: 13.2 % (ref 11.5–15.5)
WBC: 15.2 10*3/uL — ABNORMAL HIGH (ref 4.0–10.5)
nRBC: 0 % (ref 0.0–0.2)

## 2019-11-22 LAB — ECHOCARDIOGRAM COMPLETE
Height: 65 in
Weight: 2489.6 oz

## 2019-11-22 LAB — CK: Total CK: 882 U/L — ABNORMAL HIGH (ref 38–234)

## 2019-11-22 MED ORDER — MORPHINE SULFATE (CONCENTRATE) 10 MG/0.5ML PO SOLN: 5.0000 mg | ORAL | Status: DC | PRN

## 2019-11-22 MED ORDER — LORAZEPAM 1 MG PO TABS: 1.0000 mg | ORAL_TABLET | Freq: Four times a day (QID) | ORAL | Status: DC | PRN

## 2019-11-22 NOTE — Consult Note (Signed)
2 Consultation Note Date: 11/22/2019   Patient Name: Dana Hudson  DOB: 1924-01-21  MRN: [id number]  Age / Sex: 85 y.o., female  PCP: System, Pcp Not In Referring Physician: Max Sane, MD  Reason for Consultation: Establishing goals of care, Hospice Evaluation and Psychosocial/spiritual support  HPI/Patient Profile: 85 y.o. female  admitted on 11/20/2019 with past  medical history significant of dementia, HTN, CKD stage III, hypothyroidism, HLD, Anemia, Dysphagia, SVT, h/o of syncope, COPD, Benign essential tremor, Thrombocytopenia  Patient lives at home with her elderly son; she has dementia and is HOH.  Family found her down ( unknown time)    having auditory and visual hallucinations and incontinent of urine.   Blood noted from skin abrasion on right arm.  Patient is a member of the [facility].  Discussed with Trudy/LCSW with [facility] program  Currently patient is lethargic and difficult to arouse.  She is taking minimal po intake.  WBCs elevated at 15.2  Family  face treatment option decisions, advanced directive decisions, and anticipatory care needs.    Clinical Assessment and Goals of Care:  This NP Wadie Lessen reviewed medical records, received report from team, assessed the patient and then meet at the patient's bedside along with her son/Dean Mcbrayer  to discuss diagnosis, prognosis, GOC, EOL wishes disposition and options.  Concept of Hospice and Palliative Care were discussed  A  discussion was had today regarding advanced directives.  Concepts specific to code status, artifical feeding and hydration, continued IV antibiotics and rehospitalization was had.  The difference between a aggressive medical intervention path  and a palliative comfort care path for this patient at this time was had.  Values and goals of care important to patient and family were attempted to be elicited.  Discussed the  concept of human mortality and adult failure to thrive.  We discussed the limitations of medical interventions to prolong quality of life when the body does begin to fail to thrive  Family have made the decision for a full comfort path, allowing for a natural death.  We will monitor over the next 24 hours to make decision on transition of care being residential hospice vs SNF with PACE/Hospice care   Natural trajectory and expectations at EOL were discussed.  Questions and concerns addressed.   Family encouraged to call with questions or concerns.    PMT will continue to support holistically.      NEXT OF KIN/two sons    SUMMARY OF RECOMMENDATIONS    Code Status/Advance Care Planning:  DNR   Diet as tolerated for comfort feeds, known risk of aspiration  No further labs, diagnostics, IV fluids or antibiotics   Symptom Management:   Dyspnea/pain: Roxanol 5 mg po/sl every 2 hr prn  Agitation: Ativan 1 mg sl/po every 6 hrs prn  Palliative Prophylaxis:   Aspiration, Bowel Regimen, Delirium Protocol, Frequent Pain Assessment and Oral Care  Additional Recommendations (Limitations, Scope, Preferences):  Full Comfort Care  Psycho-social/Spiritual:   Desire for further Chaplaincy support:yes  Additional Recommendations:  Education on Hospice  Prognosis:   Evaluate in am- will depend on oral intake  Discharge Planning: To Be Determined      Primary Diagnoses: Present on Admission: . Sepsis ([place]) . Dehydration . Syncope . Acute lower UTI . Cardiomegaly . Acute metabolic encephalopathy . Elevated troponin . Rhabdomyolysis   I have reviewed the medical record, interviewed the patient and family, and examined the patient. The following aspects are pertinent.  Past Medical History:  Diagnosis Date  . Anemia   . Arthritis   . Cancer ([place])    SKIN  . Chronic kidney disease    STAGE 3  . Dementia ([place])   . Dementia ([place])   . Dysrhythmia   . Edema   .  GERD (gastroesophageal reflux disease)   . HOH (hard of hearing)    AIDS  . Hypothyroidism   . Palpitations   . Parkinson disease ([place])   . Paroxysmal supraventricular tachycardia [facility])    SVT/PSVT/PAT   Social History   Socioeconomic History  . Marital status: Widowed    Spouse name: Not on file  . Number of children: Not on file  . Years of education: Not on file  . Highest education level: Not on file  Occupational History  . Not on file  Tobacco Use  . Smoking status: Unknown If Ever Smoked  . Smokeless tobacco: Never Used  . Tobacco comment: tobacco use - no  Substance and Sexual Activity  . Alcohol use: No  . Drug use: No  . Sexual activity: Not on file  Other Topics Concern  . Not on file  Social History Narrative   Retired, widowed, does not get regular exercise.    Social Determinants of Health   Financial Resource Strain:   . Difficulty of Paying Living Expenses: Not on file  Food Insecurity:   . Worried About Charity fundraiser in the Last Year: Not on file  . Ran Out of Food in the Last Year: Not on file  Transportation Needs:   . Lack of Transportation (Medical): Not on file  . Lack of Transportation (Non-Medical): Not on file  Physical Activity:   . Days of Exercise per Week: Not on file  . Minutes of Exercise per Session: Not on file  Stress:   . Feeling of Stress : Not on file  Social Connections:   . Frequency of Communication with Friends and Family: Not on file  . Frequency of Social Gatherings with Friends and Family: Not on file  . Attends Religious Services: Not on file  . Active Member of Clubs or Organizations: Not on file  . Attends Archivist Meetings: Not on file  . Marital Status: Not on file   Family History  Problem Relation Age of Onset  . Sudden Cardiac Death Mother   . Leukemia Brother   . Hypertension Neg Hx   . Diabetes Neg Hx   . Stroke Neg Hx    Scheduled Meds: . levothyroxine  88 mcg Oral QAC breakfast    . metoprolol tartrate  25 mg Oral BID  . pantoprazole  40 mg Oral Daily   Continuous Infusions: PRN Meds:.acetaminophen **OR** acetaminophen, morphine CONCENTRATE, ondansetron **OR** ondansetron (ZOFRAN) IV Medications Prior to Admission:  Prior to Admission medications   Medication Sig Start Date End Date Taking? Authorizing Provider  Calcium Carbonate-Vitamin D 600-400 MG-UNIT tablet Take 1 tablet by mouth.   Yes [provider]  ferrous sulfate 325 (65 FE) MG tablet  Take 325 mg by mouth daily with breakfast.   Yes [provider]  levothyroxine (SYNTHROID, LEVOTHROID) 88 MCG tablet Take 88 mcg daily before breakfast by mouth.   Yes [provider]  omeprazole (PRILOSEC) 20 MG capsule Take 20 mg daily by mouth.   Yes [provider]  valsartan-hydrochlorothiazide (DIOVAN HCT) 80-12.5 MG tablet Take 1 tablet daily by mouth.   Yes [provider]  Calcium Carbonate (CALTRATE 600 PO) Take 600 mg by mouth daily.    [provider]   No Known Allergies Review of Systems  Unable to perform ROS: Acuity of condition    Physical Exam Constitutional:      Appearance: She is normal weight.  Cardiovascular:     Rate and Rhythm: Normal rate.  Skin:    General: Skin is warm and dry.  Neurological:     Mental Status: She is lethargic.     Motor: Weakness present.     Vital Signs: BP 123/62 (BP Location: Left Arm)   Pulse 69   Temp 98.1 F (36.7 C) (Oral)   Resp 18   Ht 5\' 5"  (1.651 m)   Wt 71.1 kg   SpO2 98%   BMI 26.09 kg/m  Pain Scale: PAINAD   Pain Score: 0-No pain   SpO2: SpO2: 98 % O2 Device:SpO2: 98 % O2 Flow Rate: .   IO: Intake/output summary:   Intake/Output Summary (Last 24 hours) at 11/22/2019 [id number] Last data filed at 11/22/2019 0600 Gross per 24 hour  Intake 661.2 ml  Output 0 ml  Net 661.2 ml    LBM:   Baseline Weight: Weight: 68 kg Most recent weight: Weight: 71.1 kg     Palliative  Assessment/Data:  30 % at best   Discussed with Dr Manuella Ghazi and [facility] program in detail, will f/u in am  Time In: 0800 Time Out: 0915 Time Total: 75 minutes  Greater than 50%  of this time was spent counseling and coordinating care related to the above assessment and plan.  Signed by: Wadie Lessen, NP   Please contact Palliative Medicine Team phone at [phone] for questions and concerns.  For individual provider: See Shea Evans

## 2019-11-22 NOTE — Progress Notes (Signed)
[facility] NAME: Dana Hudson    MR#:  [id number]  DATE OF BIRTH:  02/08/1924  SUBJECTIVE:  CHIEF COMPLAINT:   Chief Complaint  Patient presents with  . Altered Mental Status  . Fall  blank stare. No new issues. Full comfort care  REVIEW OF SYSTEMS:  ROS not able to obtain due to mental status DRUG ALLERGIES:  No Known Allergies VITALS:  Blood pressure 123/62, pulse 69, temperature 98.1 F (36.7 C), temperature source Oral, resp. rate 18, height 5\' 5"  (1.651 m), weight 71.1 kg, SpO2 98 %. PHYSICAL EXAMINATION:  Physical Exam Constitutional:      Appearance: She is underweight.  HENT:     Head: Normocephalic and atraumatic.  Eyes:     Conjunctiva/sclera: Conjunctivae normal.     Pupils: Pupils are equal, round, and reactive to light.  Neck:     Thyroid: No thyromegaly.     Trachea: No tracheal deviation.  Cardiovascular:     Rate and Rhythm: Normal rate and regular rhythm.     Heart sounds: Normal heart sounds.  Pulmonary:     Effort: Pulmonary effort is normal. No respiratory distress.     Breath sounds: Normal breath sounds. No wheezing.  Chest:     Chest wall: No tenderness.  Abdominal:     General: Bowel sounds are normal. There is no distension.     Palpations: Abdomen is soft.     Tenderness: There is no abdominal tenderness.  Musculoskeletal:        General: Normal range of motion.     Cervical back: Normal range of motion and neck supple.  Skin:    General: Skin is warm and dry.     Findings: No rash.  Neurological:     Mental Status: She is alert. She is confused.     Cranial Nerves: No cranial nerve deficit.     Comments: Doesn't communicate, has blank stare    LABORATORY PANEL:  Female CBC Recent Labs  Lab 11/22/19 0522  WBC 15.2*  HGB 11.3*  HCT 35.7*  PLT 168   ------------------------------------------------------------------------------------------------------------------ Chemistries  Recent Labs   Lab 11/21/19 0518 11/22/19 0522  NA 145 144  K 3.8 3.5  CL 110 112*  CO2 22 20*  GLUCOSE 95 97  BUN 41* 35*  CREATININE 1.64* 1.34*  CALCIUM 8.5* 8.4*  MG 1.5*  --   AST 50* 50*  ALT 15 18  ALKPHOS 32* 30*  BILITOT 0.9 1.1   RADIOLOGY:  No results found. ASSESSMENT AND PLAN:  85 y.o. female with medical history significant of dementia, HTN, CKD stage III, hypothyroidism, HLD, Anemia, Dysphagia, SVT, h/o of syncope, COPD, Benign essential tremor, Thrombocytopenia  Admitted for Sepsis, syncope  Present on Admission: Acute metabolic encephalopathy -  multifactorial - MRI of the brain neg for acute patho  - comfort care per PC   . Sepsis ([place]) - present on admission due to UTI . Dehydration  * Rhabdomyolysis:  . Syncope -  carotid dopplers showing no significant stenosis . Acute lower UTI -  treated with rocephin . Cardiomegaly  * Nonsustained V-Tach  Elevated trop -due to demand ischemia   Overall very poor prognosis, high risk for cardio resp failure and death.  Appreciate palliative care input. Full Comfort care in place.   All the records are reviewed and case discussed with Care Management/Social Worker. Management plans discussed with the patient, palliative care and they are in agreement.  CODE STATUS: DNR, comfort care  TOTAL TIME TAKING CARE OF THIS PATIENT: 35 minutes.   More than 50% of the time was spent in counseling/coordination of care: YES  POSSIBLE D/C IN 1-2 DAYS, DEPENDING ON CLINICAL CONDITION.   Max Sane M.D on 11/22/2019 at 8:21 PM  Between 7am to 6pm - Pager - [phone]  After 6pm go to [email or web address] - password [email or web address]  [facility]   CC: Primary care physician; System, Pcp Not In  Note: This dictation was prepared with Dragon dictation along with smaller phrase technology. Any transcriptional errors that result from this process are unintentional.

## 2019-11-22 NOTE — Evaluation (Signed)
Clinical/Bedside Swallow Evaluation Patient Details  Name: Dana Hudson MRN: [id number] Date of Birth: 1924-09-03  Today's Date: 11/22/2019 Time: SLP Start Time (ACUTE ONLY): 1150 SLP Stop Time (ACUTE ONLY): 1235 SLP Time Calculation (min) (ACUTE ONLY): 45 min  Past Medical History:  Past Medical History:  Diagnosis Date  . Anemia   . Arthritis   . Cancer ([place])    SKIN  . Chronic kidney disease    STAGE 3  . Dementia ([place])   . Dementia ([place])   . Dysrhythmia   . Edema   . GERD (gastroesophageal reflux disease)   . HOH (hard of hearing)    AIDS  . Hypothyroidism   . Palpitations   . Parkinson disease ([place])   . Paroxysmal supraventricular tachycardia [facility])    SVT/PSVT/PAT   Past Surgical History:  Past Surgical History:  Procedure Laterality Date  . ABDOMINAL HYSTERECTOMY    . CARDIAC ELECTROPHYSIOLOGY STUDY AND ABLATION    . CATARACT EXTRACTION W/PHACO Right 10/14/2017   Procedure: CATARACT EXTRACTION PHACO AND INTRAOCULAR LENS PLACEMENT (IOC);  Surgeon: Birder Robson, MD;  Location: [facility] ORS;  Service: Ophthalmology;  Laterality: Right;  [place] 01:04 AP% 16.9 CDE 10.96 Fluid pack lot # [id number]  . CATARACT EXTRACTION W/PHACO Left 11/11/2017   Procedure: CATARACT EXTRACTION PHACO AND INTRAOCULAR LENS PLACEMENT (IOC);  Surgeon: Birder Robson, MD;  Location: [facility] ORS;  Service: Ophthalmology;  Laterality: Left;  [place] 00:41 AP% 15.1 CDE 6.22 Fluid pack lot # [id number]  . JOINT REPLACEMENT     TOTAL KNEE  . total knee arthroplasty     HPI:  Pt is a 85 y.o. female admitted after family found her on the floor in her home for unknown period of time. Pt had urinated on herself, blood on the floor. Neighbors noted that she has been having auditory and visual hallucinations and foul smelling urine with medical history significant of Dementia, HTN, CKD stage III, hypothyroidism, HLD, Anemia, Dysphagia, SVT, h/o of syncope, COPD, Benign essential tremor, Thrombocytopenia.  She  presented with confusion, and a fall unable to provide history -- patient has significant Dementia baseline. SHe lives at home by herself neighbors and Son noted that she has been having increased Confusion.  Pt had a Barium swallow study (GI) in 2007 indicating inconsistent laryngeal penetration and a Hiatal Hernia.  CXR on 11/20/2019: "Enlargement of cardiac silhouette with subsegmental atelectasis LEFT; lungs otherwise clear".  Son stated pt eats very little at home, "maybe crackers and Sprite" baseline; noted BMI.   Assessment / Plan / Recommendation Clinical Impression  Pt appears to present w/ grossly adequate oropharyngeal phase swallowing function w/ reduced risk for aspiration w/ oral intake when following general aspiration precautions and when given support at meals for feeding(pt has declined Cognitive status/Dementia at baseline). Pt does exhibit min decreased awareness of her environment and is easily distracted by environmental stimuli; also HOH not wearing HAs. She also exhibits min increased oral phase time w/ solid foods - suspect an impact from her baseline Dementia/Cognitive decline. This presentation of ease of distraction and increased chewing on solids can impact oral intake/meals and increase pt's risk for aspiration to occur. She requires min+ cues for orientation to po tasks, setup for self-feeding at this eval. HOWEVER, pt did not feed herself and required full support by SLP. Pt consumed trials of Thin liquids via cup and straw then purees/softened solids w/ no immediate, overt clinical s/s of aspiration noted; no decline in vocal quality during phonations or decline  in respiratory status during/post trials. Pt exhibited a throat clearing x1 during increased bolus piece-mealing(appeared) when drinking liquids and was Not noted otherwise during session. Oral phase was grossly [facility] for bolus management and oral clearing of the trials given. Although, she did require increased Time for  mastication/munching of more solid foods d/t Cognitive decline. Given time to chew/munch, she was able to clear orally -- alternating foods/liquids to aid chewing/clearing appeared helpful as well. OM exam appeared grossly [facility] for lingual/labial movements during bolus management/clearing post swallowing -- no unilateral weakness noted. Speech grossly clear.  Recommend a Dysphagia level 3 (Minced meats w/ gravies to moisten) and Thin liquids -- monitor straw use; general aspiration precautions -- MUST sit fully upright w/ all oral intake; reduce Distractions during meals. Pills Whole vs Crush in Puree for safer swallowing. Support w/ tray setup  and feeding at meals. NSG updated.  SLP Visit Diagnosis: Dysphagia, oral phase (R13.11)(Baseline Dementia)    Aspiration Risk  Mild aspiration risk;Risk for inadequate nutrition/hydration(reduced following general precautions)    Diet Recommendation  Dysphagia level 3 (Minced meats w/ gravies to moisten), Thin liquids. General aspiration precaution and feeding support at meals -- Must sit Fully Upright w/ oral intake; Reduce Distractions at meals  Medication Administration: Whole meds with puree(vs Crushed as needed for safer swallowing/clearing)    Other  Recommendations Recommended Consults: (Dietician f/u; Palliative Care for [place]) Oral Care Recommendations: Oral care BID;Oral care before and after PO;Staff/trained caregiver to provide oral care(Dentures) Other Recommendations: (n/a)   Follow up Recommendations None      Frequency and Duration min 2x/week  1 week       Prognosis Prognosis for Safe Diet Advancement: Fair Barriers to Reach Goals: Cognitive deficits;Time post onset;Severity of deficits      Swallow Study   General Date of Onset: 11/20/19 HPI: Pt is a 85 y.o. female admitted after family found her on the floor in her home for unknown period of time. Pt had urinated on herself, blood on the floor. Neighbors noted that she has been  having auditory and visual hallucinations and foul smelling urine with medical history significant of Dementia, HTN, CKD stage III, hypothyroidism, HLD, Anemia, Dysphagia, SVT, h/o of syncope, COPD, Benign essential tremor, Thrombocytopenia.  She presented with confusion, and a fall unable to provide history -- patient has significant Dementia baseline. SHe lives at home by herself neighbors and Son noted that she has been having increased Confusion.  Pt had a Barium swallow study (GI) in 2007 indicating inconsistent laryngeal penetration and a Hiatal Hernia.  CXR on 11/20/2019: "Enlargement of cardiac silhouette with subsegmental atelectasis LEFT; lungs otherwise clear".  Son stated pt eats very little at home, "maybe crackers and Sprite" baseline; noted BMI. Type of Study: Bedside Swallow Evaluation Previous Swallow Assessment: none reported Diet Prior to this Study: Regular;Thin liquids Temperature Spikes Noted: No(wbc 15.2) Respiratory Status: Room air History of Recent Intubation: No Behavior/Cognition: Alert;Cooperative;Pleasant mood;Confused;Distractible;Requires cueing(HOH) Oral Cavity Assessment: Within Functional Limits Oral Care Completed by SLP: Yes Oral Cavity - Dentition: Dentures, top;Dentures, bottom Vision: (n/a) Self-Feeding Abilities: Total assist(helped to hold Cup min) Patient Positioning: Upright in bed(needed full positioning) Baseline Vocal Quality: Normal(HOH) Volitional Cough: Cognitively unable to elicit Volitional Swallow: Unable to elicit    Oral/Motor/Sensory Function Overall Oral Motor/Sensory Function: Within functional limits   Ice Chips Ice chips: Within functional limits Presentation: Spoon(fed; 2 trials)   Thin Liquid Thin Liquid: Within functional limits(grossly ) Presentation: Cup;Self Fed;Straw(fully supported, guided; 8 trials accepted)  Other Comments: declined further stating "I'm full now"    Nectar Thick Nectar Thick Liquid: Not tested   Honey  Thick Honey Thick Liquid: Not tested   Puree Puree: Within functional limits Presentation: Spoon(fed; 4 trials) Other Comments: declined further stating "I'm full now"   Solid     Solid: Impaired Presentation: Spoon(fed; softened solids (4 trials)) Oral Phase Impairments: Impaired mastication;Poor awareness of bolus(min+ increased) Oral Phase Functional Implications: Impaired mastication;Prolonged oral transit Pharyngeal Phase Impairments: (none) Other Comments: declined further stating "I'm full now"       Orinda Kenner, MS, CCC-SLP Shonta Bourque 11/22/2019,2:42 PM

## 2019-11-22 NOTE — Progress Notes (Signed)
[name]    SUBJECTIVE: Hard of hearing lying in bed slightly confused at times denies any pain no shortness of breath no vertigo no lightheadedness denies any palpitations or tachycardia feels reasonably well   Vitals:   11/21/19 0804 11/21/19 2011 11/22/19 0459 11/22/19 0946  BP: 140/77 (!) 148/72 (!) 140/55 123/62  Pulse: 73 72 76 69  Resp: 19 18 18    Temp: 98.9 F (37.2 C) 99.5 F (37.5 C) (!) 97.5 F (36.4 C) 98.1 F (36.7 C)  TempSrc: Oral Oral Oral Oral  SpO2: 99% 96% 94% 98%  Weight:   71.1 kg   Height:         Intake/Output Summary (Last 24 hours) at 11/22/2019 1327 Last data filed at 11/22/2019 0600 Gross per 24 hour  Intake 661.2 ml  Output 0 ml  Net 661.2 ml      PHYSICAL EXAM  General: Well developed, well nourished, in no acute distress HEENT:  Normocephalic and atramatic Neck:  No JVD.  Lungs: Clear bilaterally to auscultation and percussion. Heart: HRRR . Normal S1 and S2 without gallops or murmurs.  Abdomen: Bowel sounds are positive, abdomen soft and non-tender  Msk:  Back normal, normal gait. Normal strength and tone for age. Extremities: No clubbing, cyanosis or edema.   Neuro: Alert and oriented X 3. Psych:  Good affect, responds appropriately   LABS: Basic Metabolic Panel: Recent Labs    11/21/19 0518 11/22/19 0522  NA 145 144  K 3.8 3.5  CL 110 112*  CO2 22 20*  GLUCOSE 95 97  BUN 41* 35*  CREATININE 1.64* 1.34*  CALCIUM 8.5* 8.4*  MG 1.5*  --   PHOS 3.3  --    Liver Function Tests: Recent Labs    11/21/19 0518 11/22/19 0522  AST 50* 50*  ALT 15 18  ALKPHOS 32* 30*  BILITOT 0.9 1.1  PROT 6.1* 6.3*  ALBUMIN 3.6 3.5   No results for input(s): LIPASE, AMYLASE in the last 72 hours. CBC: Recent Labs    11/20/19 1946 11/21/19 0518 11/22/19 0522  WBC 14.7* 13.6* 15.2*  NEUTROABS 12.3*  --   --   HGB 12.1 11.7* 11.3*  HCT 36.6 35.6* 35.7*  MCV 90.4 91.8 96.5  PLT 189 159 168   Cardiac Enzymes: Recent Labs      11/20/19 2245 11/22/19 0522  CKTOTAL 1,183* 882*   BNP: Invalid input(s): POCBNP D-Dimer: No results for input(s): DDIMER in the last 72 hours. Hemoglobin A1C: No results for input(s): HGBA1C in the last 72 hours. Fasting Lipid Panel: No results for input(s): CHOL, HDL, LDLCALC, TRIG, CHOLHDL, LDLDIRECT in the last 72 hours. Thyroid Function Tests: Recent Labs    11/21/19 0518  TSH 2.766   Anemia Panel: No results for input(s): VITAMINB12, FOLATE, FERRITIN, TIBC, IRON, RETICCTPCT in the last 72 hours.  CT Head Wo Contrast  Result Date: 11/20/2019 CLINICAL DATA:  Altered mental status. Possible fall. History of dementia. No given history of malignancy. EXAM: CT HEAD WITHOUT CONTRAST TECHNIQUE: Contiguous axial images were obtained from the base of the skull through the vertex without intravenous contrast. COMPARISON:  Report only from PET-CT 01/14/2003. Whole-body bone scan 04/08/2007. FINDINGS: Brain: There is no evidence of acute intracranial hemorrhage, mass lesion, brain edema or extra-axial fluid collection. There is atrophy with prominence of the ventricles and subarachnoid spaces. Minimal chronic small vessel ischemic changes in the periventricular white matter. There is no CT evidence of acute cortical infarction. Vascular: Mild intracranial vascular  calcifications. No hyperdense vessel identified. Skull: There is a 1.7 cm lucent lesion in the left parietal bone on image 59/2. This appears nonaggressive and may reflect a hemangioma. No acute osseous findings. Sinuses/Orbits: The visualized paranasal sinuses and mastoid air cells are clear. Previous bilateral lens surgery. Other: Right TMJ osteoarthritis. IMPRESSION: 1. No acute intracranial or calvarial findings. 2. Lucent lesion in the left parietal bone, unlikely to be clinically significant in this 85 year old, and possibly a hemangioma. Electronically Signed   By: Richardean Sale M.D.   On: 11/20/2019 20:49   MR BRAIN WO  CONTRAST  Result Date: 11/21/2019 CLINICAL DATA:  Ataxia, stroke. Encephalopathy. Personal history of dementia with acute onset of increased confusion. EXAM: MRI HEAD WITHOUT CONTRAST TECHNIQUE: Multiplanar, multiecho pulse sequences of the brain and surrounding structures were obtained without intravenous contrast. COMPARISON:  CT head without contrast 11/20/2019. FINDINGS: Brain: Mild atrophy and white matter changes are within normal limits for age. The ventricles are of normal size. No significant extraaxial fluid collection is present. No acute infarct, hemorrhage, or mass lesion is present. The internal auditory canals are within normal limits. The brainstem and cerebellum are within normal limits. Vascular: Flow is present in the major intracranial arteries. Skull and upper cervical spine: T2 hyperintense lesion in the right parotid gland measures 10 mm none other focal soft tissue lesions are present. Parietal skull lesion demonstrates T1 shortening, likely hemangioma. Craniocervical junction is normal. Mild degenerative changes are present in the upper cervical spine. Sinuses/Orbits: The paranasal sinuses and mastoid air cells are clear. Bilateral lens replacements are noted. Globes and orbits are otherwise unremarkable. IMPRESSION: 1. Normal MRI appearance of the brain for age. No acute or focal lesion to explain the patient's symptoms. 2. 10 mm cystic lesion in the right parotid gland, likely benign. No follow-up recommended. Electronically Signed   By: San Morelle M.D.   On: 11/21/2019 18:41   US Carotid Bilateral  Result Date: 11/21/2019 CLINICAL DATA:  85 year old female with syncope EXAM: BILATERAL CAROTID DUPLEX ULTRASOUND TECHNIQUE: Pearline Cables scale imaging, color Doppler and duplex ultrasound were performed of bilateral carotid and vertebral arteries in the neck. COMPARISON:  None. FINDINGS: Criteria: Quantification of carotid stenosis is based on velocity parameters that correlate the  residual internal carotid diameter with NASCET-based stenosis levels, using the diameter of the distal internal carotid lumen as the denominator for stenosis measurement. The following velocity measurements were obtained: RIGHT ICA: 95/18 cm/sec CCA: [id number] cm/sec SYSTOLIC ICA/CCA RATIO:  1.2 ECA:  110 cm/sec LEFT ICA: 95/23 cm/sec CCA: [id number] cm/sec SYSTOLIC ICA/CCA RATIO:  1.2 ECA:  150 cm/sec RIGHT CAROTID ARTERY: Mild heterogeneous atherosclerotic plaque in the proximal internal carotid artery. By peak systolic velocity criteria, the estimated stenosis is less than 50%. RIGHT VERTEBRAL ARTERY:  Patent with normal antegrade flow. LEFT CAROTID ARTERY: Trace heterogeneous atherosclerotic plaque in the proximal internal carotid artery. By peak systolic velocity criteria, the estimated stenosis remains less than 50%. LEFT VERTEBRAL ARTERY:  Patent with normal antegrade flow. IMPRESSION: 1. Mild (1-49%) stenosis proximal right internal carotid artery secondary to mild heterogeneous atherosclerotic plaque. 2. Mild (1-49%) stenosis proximal left internal carotid artery secondary to trace heterogeneous atherosclerotic plaque. 3. Vertebral arteries are patent with normal antegrade flow. Signed, Criselda Peaches, MD, [facility]iology Electronically Signed   By: Jacqulynn Cadet M.D.   On: 11/21/2019 15:17   DG Chest Port 1 View  Result Date: 11/20/2019 CLINICAL DATA:  Increased confusion, foul smelling urine,  history dementia, stage III chronic kidney disease, Parkinson's, GERD EXAM: PORTABLE CHEST 1 VIEW COMPARISON:  Portable exam 1936 hours compared to 06/13/2007 FINDINGS: Enlargement of cardiac silhouette. Mediastinal contours and pulmonary vascularity normal. Atherosclerotic calcification aorta. Subsegmental atelectasis LEFT base. Lungs otherwise clear. No pulmonary infiltrate, pleural effusion or pneumothorax. Bones demineralized. IMPRESSION:  Enlargement of cardiac silhouette with subsegmental atelectasis LEFT base. Electronically Signed   By: [name]   On: 11/20/2019 20:04   ECHOCARDIOGRAM COMPLETE  Result Date: 11/22/2019   ECHOCARDIOGRAM REPORT   Patient Name:   Dana Hudson Date of Exam: 11/21/2019 Medical Rec #:  [id number]     Height:       65.0 in Accession #:    [id number]    Weight:       155.6 lb Date of Birth:  06/13/1924     BSA:          1.78 m Patient Age:    85 years      BP:           140/77 mmHg Patient Gender: F             HR:           73 bpm. Exam Location:  [facility] Procedure: 2D Echo, Cardiac Doppler and Color Doppler Indications:     Cardiomegaly 429.3  History:         Patient has no prior history of Echocardiogram examinations.                  Edema, SVT, palps.  Sonographer:     Alyse Low Roar Referring Phys:  [place] Diagnosing Phys: Yolonda Kida MD IMPRESSIONS  1. Left ventricular ejection fraction, by visual estimation, is 50 to 55%. The left ventricle has normal function. Normal left ventricular posterior wall thickness. There is no left ventricular hypertrophy.  2. The left ventricle has no regional wall motion abnormalities.  3. Global right ventricle has normal systolic function.The right ventricular size is normal. No increase in right ventricular wall thickness.  4. Left atrial size was mild-moderately dilated.  5. Right atrial size was moderately dilated.  6. The mitral valve is normal in structure. Mild mitral valve regurgitation.  7. The tricuspid valve is normal in structure.  8. The aortic valve is tricuspid. Aortic valve regurgitation is not visualized. Mild to moderate aortic valve sclerosis/calcification without any evidence of aortic stenosis.  9. The pulmonic valve was normal in structure. Pulmonic valve regurgitation is not visualized. 10. Moderately elevated pulmonary artery systolic pressure. FINDINGS  Left Ventricle: Left ventricular ejection fraction, by visual estimation,  is 50 to 55%. The left ventricle has normal function. The left ventricle has no regional wall motion abnormalities. Normal left ventricular posterior wall thickness. There is no left ventricular hypertrophy. Right Ventricle: The right ventricular size is normal. No increase in right ventricular wall thickness. Global RV systolic function is has normal systolic function. The tricuspid regurgitant velocity is 3.28 m/s, and with an assumed right atrial pressure  of 10 mmHg, the estimated right ventricular systolic pressure is moderately elevated at 52.9 mmHg. Left Atrium: Left atrial size was mild-moderately dilated. Right Atrium: Right atrial size was moderately dilated Pericardium: There is no evidence of pericardial effusion. Mitral Valve: The mitral valve is normal in structure. Mild mitral valve regurgitation. Tricuspid Valve: The tricuspid valve is normal in structure. Tricuspid valve regurgitation moderate-severe. Aortic Valve: The aortic valve is tricuspid. Aortic valve regurgitation is not visualized. Mild  to moderate aortic valve sclerosis/calcification is present, without any evidence of aortic stenosis. Aortic valve mean gradient measures 6.0 mmHg. Aortic valve peak gradient measures 9.9 mmHg. Aortic valve area, by VTI measures 1.25 cm. Pulmonic Valve: The pulmonic valve was normal in structure. Pulmonic valve regurgitation is not visualized. Pulmonic regurgitation is not visualized. Aorta: The aortic root is normal in size and structure. IAS/Shunts: No atrial level shunt detected by color flow Doppler.  LEFT VENTRICLE PLAX 2D LVIDd:         4.08 cm  Diastology LVIDs:         3.01 cm  LV e' lateral:   13.80 cm/s LV PW:         0.96 cm  LV E/e' lateral: 8.7 LV IVS:        1.20 cm  LV e' medial:    8.70 cm/s LVOT diam:     1.80 cm  LV E/e' medial:  13.8 LV SV:         38 ml LV SV Index:   21.01 LVOT Area:     2.54 cm  RIGHT VENTRICLE RV S prime:     13.70 cm/s TAPSE (M-mode): 3.0 cm LEFT ATRIUM               Index       RIGHT ATRIUM           Index LA diam:        4.70 cm  2.64 cm/m  RA Area:     20.30 cm LA Vol (A2C):   70.6 ml  39.71 ml/m RA Volume:   61.60 ml  34.65 ml/m LA Vol (A4C):   102.0 ml 57.37 ml/m LA Biplane Vol: 87.9 ml  49.44 ml/m  AORTIC VALVE                    PULMONIC VALVE AV Area (Vmax):    1.30 cm     PV Vmax:        0.94 m/s AV Area (Vmean):   1.23 cm     PV Peak grad:   3.5 mmHg AV Area (VTI):     1.25 cm     RVOT Peak grad: 1 mmHg AV Vmax:           157.00 cm/s AV Vmean:          110.000 cm/s AV VTI:            0.335 m AV Peak Grad:      9.9 mmHg AV Mean Grad:      6.0 mmHg LVOT Vmax:         80.40 cm/s LVOT Vmean:        53.100 cm/s LVOT VTI:          0.164 m LVOT/AV VTI ratio: 0.49  AORTA Ao Root diam: 2.70 cm MITRAL VALVE                        TRICUSPID VALVE MV Area (PHT): 3.99 cm             TR Peak grad:   42.9 mmHg MV PHT:        55.10 msec           TR Vmax:        333.00 cm/s MV Decel Time: 190 msec MV E velocity: 120.00 cm/s 103 cm/s SHUNTS  Systemic VTI:  0.16 m                                     Systemic Diam: 1.80 cm  Bradey Luzier D Aleksa Collinsworth MD Electronically signed by Yolonda Kida MD Signature Date/Time: 11/22/2019/10:33:06 AM    Final      Echo preserved left ventricular function of 55% minor valvular abnormalities dilated right side mildly increased pulmonary pressures  TELEMETRY: Normal sinus rhythm occasional PVCs no significant runs of VT  ASSESSMENT AND PLAN:  Active Problems:   Sepsis (HCC)   Dehydration   Syncope   Acute lower UTI   Cardiomegaly   Acute metabolic encephalopathy   Elevated troponin   Rhabdomyolysis    1.  Nonsustained VT SVT tachycardia Possible sepsis Urinary tract infection Metabolic encephalopathy Elevated troponins Syncope Dehydration Chronic renal insufficiency stage III Status post fall . Plan Continue telemetry Agree with broad-spectrum antibiotic therapy Hydration for  elevated CKs consider bicarb to protect kidneys Recommend metoprolol as needed for rate management and to suppress PVCs and VT Gentle hydration for mild dehydration symptoms Altered mental status management treat infection dehydration Continue physical therapy with gait training and balance to help prevent falls No further syncope unclear etiology recommend conservative management     Yolonda Kida, MD, PHD [facility] 11/22/2019 1:27 PM

## 2019-11-22 NOTE — Plan of Care (Signed)
  Problem: Health Behavior/Discharge Planning: Goal: Ability to manage health-related needs will improve Outcome: Progressing   Problem: Clinical Measurements: Goal: Respiratory complications will improve Outcome: Progressing Goal: Cardiovascular complication will be avoided Outcome: Progressing   Problem: Activity: Goal: Risk for activity intolerance will decrease Outcome: Progressing   Problem: Education: Goal: Knowledge of the prescribed therapeutic regimen will improve Outcome: Progressing   Problem: Clinical Measurements: Goal: Quality of life will improve Outcome: Progressing   Problem: Education: Goal: Knowledge of General Education information will improve Description: Including pain rating scale, medication(s)/side effects and non-pharmacologic comfort measures Outcome: Not Progressing   Problem: Pain Management: Goal: Satisfaction with pain management regimen will improve Outcome: Completed/Met

## 2019-11-22 NOTE — Evaluation (Signed)
Physical Therapy Evaluation Patient Details Name: Dana Hudson MRN: [id number] DOB: February 17, 1924 Today's Date: 11/22/2019   History of Present Illness  Dana Hudson is a 85 y.o. female admitted after family found her on the floor for unknown period of time, blood everywhere and pt had urinated on herself, neighbors noted that she has been having auditory and visual hallucinations and foul smelling urine with medical history significant of dementia, HTN, CKD stage III, hypothyroidism, HLD, Anemia, Dysphagia, SVT, h/o of syncope, COPD, Benign essential tremor, Thrombocytopenia  Clinical Impression  Pt is a 85 yo female admitted for above. Pt received in bed upon arrival. Pt confused and disoriented. Pt believing she was at [facility] with her son present. Pt mostly non verbal throughout session. Pt has pmh of dementia, but no family member present to determine baseline cognitive functioning compared to current cognition. Pt follows one step commands inconsistently with increased time. Pt required max A to get EOB, attempt standing and return to supine/repositioning in bed. Pt progressed to min guard to maintain sitting balance EOB. Unsure of pts PLOF secondary to decreased cognition and mostly non verbal. Home set up through chart review. Pt presents with decreased cognition, strength, ROM, balance and activity tolerance. Pt would benefit from acute PT to improve deficits and recommendation for SNF following discharge in order to further improve deficits and decrease fall risk and caregiver burden.     Follow Up Recommendations SNF;Supervision/Assistance - 24 hour    Equipment Recommendations  Other (comment)(TBD next venue)    Recommendations for Other Services       Precautions / Restrictions Precautions Precautions: Fall Restrictions Weight Bearing Restrictions: No      Mobility  Bed Mobility Overal bed mobility: Needs Assistance Bed Mobility: Supine to Sit;Sit to Supine     Supine to  sit: Max assist;HOB elevated Sit to supine: Max assist;HOB elevated   General bed mobility comments: Max A to get EOB and return to supine and for repositioning in bed, max multimodal cuing for initiation, cuing and technique  Transfers Overall transfer level: Needs assistance Equipment used: Rolling walker (2 wheeled) Transfers: Sit to/from Stand Sit to Stand: Max assist         General transfer comment: able to get almost full upright standing with max A and RW, max multimodal cuing for technique, hand/foot placement, not able to get fully upright this date and pt immediately requesting to return to bed, quickly sitting back down, suspect pt wil need +2 for transfers and ambulation  Ambulation/Gait             General Gait Details: deferred for safety  Stairs            Wheelchair Mobility    Modified Rankin (Stroke Patients Only)       Balance Overall balance assessment: Needs assistance Sitting-balance support: Bilateral upper extremity supported;Feet supported Sitting balance-Leahy Scale: Fair Sitting balance - Comments: pt progressed to min guard to sit EOB, required B UE support and has mild posterior lean     Standing balance-Leahy Scale: Zero                               Pertinent Vitals/Pain Pain Assessment: No/denies pain    Home Living Family/patient expects to be discharged to:: Private residence Living Arrangements: Alone Available Help at Discharge: Family Type of Home: Apartment       Home Layout: One level   Additional  Comments: Patient was confused and unable to accurately give information, chart indicates she lives next door to her son in an apartment and has assistance with self care tasks.    Prior Function Level of Independence: Needs assistance         Comments: It is unclear what the patients prior level of function is.     Hand Dominance   Dominant Hand: Right    Extremity/Trunk Assessment   Upper  Extremity Assessment Upper Extremity Assessment: Generalized weakness    Lower Extremity Assessment Lower Extremity Assessment: Generalized weakness;Difficult to assess due to impaired cognition(grossly 3/5, able to perform ankle pumps, SAQ and heel slides in supine independently)       Communication   Communication: No difficulties  Cognition Arousal/Alertness: Awake/alert Behavior During Therapy: WFL for tasks assessed/performed Overall Cognitive Status: History of cognitive impairments - at baseline                                 General Comments: pt confused and most non verbal, pt stated the man in the room was her son (no one present besides PT), pt thought she was at [facility], follows one step commands inconsistently with increased time      General Comments      Exercises Total Joint Exercises Ankle Circles/Pumps: AROM;Both;10 reps Short Arc Quad: AROM;Both;10 reps Heel Slides: AROM;Both;10 reps   Assessment/Plan    PT Assessment Patient needs continued PT services  PT Problem List Decreased strength;Decreased mobility;Decreased safety awareness;Decreased range of motion;Decreased cognition;Decreased activity tolerance;Decreased balance;Decreased knowledge of use of DME       PT Treatment Interventions DME instruction;Therapeutic exercise;Gait training;Balance training;Stair training;Neuromuscular re-education;Functional mobility training;Therapeutic activities;Patient/family education;Cognitive remediation    PT Goals (Current goals can be found in the Care Plan section)  Acute Rehab PT Goals PT Goal Formulation: Patient unable to participate in goal setting    Frequency Min 2X/week   Barriers to discharge Decreased caregiver support      Co-evaluation               AM-PAC PT "6 Clicks" Mobility  Outcome Measure Help needed turning from your back to your side while in a flat bed without using bedrails?: A Lot Help needed moving from lying  on your back to sitting on the side of a flat bed without using bedrails?: A Lot Help needed moving to and from a bed to a chair (including a wheelchair)?: A Lot Help needed standing up from a chair using your arms (e.g., wheelchair or bedside chair)?: A Lot Help needed to walk in hospital room?: Total Help needed climbing 3-5 steps with a railing? : Total 6 Click Score: 10    End of Session Equipment Utilized During Treatment: Gait belt Activity Tolerance: Patient tolerated treatment well Patient left: in bed;with call bell/phone within reach;with bed alarm set Nurse Communication: Mobility status PT Visit Diagnosis: Muscle weakness (generalized) (M62.81);Difficulty in walking, not elsewhere classified (R26.2)    Time: [id number] PT Time Calculation (min) (ACUTE ONLY): 19 min   Charges:   PT Evaluation $PT Eval Moderate Complexity: 1 Mod PT Treatments $Therapeutic Activity: 8-22 mins        Gina Costilla PT, DPT 10:12 AM,11/22/19   Lynn Recendiz Drucilla Chalet 11/22/2019, 10:08 AM

## 2019-11-23 DIAGNOSIS — R531 Weakness: Secondary | ICD-10-CM

## 2019-11-23 DIAGNOSIS — Z66 Do not resuscitate: Secondary | ICD-10-CM

## 2019-11-23 DIAGNOSIS — R627 Adult failure to thrive: Secondary | ICD-10-CM

## 2019-11-23 LAB — URINE CULTURE: Culture: >=100000 — AB

## 2019-11-23 NOTE — Progress Notes (Signed)
Patient ID: BESTY CEBOLLERO, female   DOB: June 14, 1924, 85 y.o.   MRN: [id number]  This NP visited patient at the bedside as a follow up to  yesterday's [place].  Patient is remarkably better today; alert and oriented to person and she is eating and drinking well according to nursing.  I spoke to son by telephone to update him on his mother's current medical situation; with this dramatic turn around she really is not residential hospice eligible.  She would still benefit from hospice services on discharge, however family is requesting an evaluation from physical therapy.    Discussed with son that although she is improved today she remains high risk for decompensation.  Family verbalized understanding and are comfortable with their decision to focus solely on comfort and dignity at this time in her life.  Verbalizes that family would like patient placed for long-term care and that they cannot safely take care of her at home anymore  Spoke with [facility] Trudy/LCSW, they will begin to look for a bed.    Plan of care -DNR/DNI -No artificial feeding or hydration now or in the future -Avoid rehospitalization -No further antibiotic use unless it enhances level of comfort -Transition to SNF for long-term care with hospice versus short-term rehab -Continue under the [facility] program  Discussed with family the importance of continued conversation with the   medical providers regarding overall plan of care and treatment options,  ensuring decisions are within the context of the patients values and GOCs.  Questions and concerns addressed   Discussed with Dr Manuella Ghazi and bedside RN and Felicita Gage LCSW  Total time spent on the unit was 35 minutes  Greater than 50% of the time was spent in counseling and coordination of care  Wadie Lessen NP  Palliative Medicine Team Team Phone # [phone] Pager [phone]

## 2019-11-23 NOTE — Clinical Social Work Note (Signed)
Left voicemail for [facility] social worker to discuss SNF with hospice. Per palliative NP they are checking to see if one of their facilities has a bed available.  [place] Scrape, [place]

## 2019-11-23 NOTE — Progress Notes (Addendum)
[employer] [facility] [employer] NAME: Dana Hudson    MR#:  [id number]  DATE OF BIRTH:  [birth year]  SUBJECTIVE:  CHIEF COMPLAINT:   Chief Complaint  Patient presents with  . Altered Mental Status  . Fall  awake, son at bedside - per nursing and family she is more interactive and eating since y'day REVIEW OF SYSTEMS:  ROS not able to obtain due to mental status DRUG ALLERGIES:  No Known Allergies VITALS:  Blood pressure (!) 117/37, pulse (!) 53, temperature 98.4 F (36.9 C), temperature source Oral, resp. rate 16, height 5\' 5"  (1.651 m), weight 71.1 kg, SpO2 92 %. PHYSICAL EXAMINATION:  Physical Exam Constitutional:      Appearance: She is underweight.  HENT:     Head: Normocephalic and atraumatic.  Eyes:     Conjunctiva/sclera: Conjunctivae normal.     Pupils: Pupils are equal, round, and reactive to light.  Neck:     Thyroid: No thyromegaly.     Trachea: No tracheal deviation.  Cardiovascular:     Rate and Rhythm: Normal rate and regular rhythm.     Heart sounds: Normal heart sounds.  Pulmonary:     Effort: Pulmonary effort is normal. No respiratory distress.     Breath sounds: Normal breath sounds. No wheezing.  Chest:     Chest wall: No tenderness.  Abdominal:     General: Bowel sounds are normal. There is no distension.     Palpations: Abdomen is soft.     Tenderness: There is no abdominal tenderness.  Musculoskeletal:        General: Normal range of motion.     Cervical back: Normal range of motion and neck supple.  Skin:    General: Skin is warm and dry.     Findings: No rash.  Neurological:     Mental Status: She is alert. She is confused.     Cranial Nerves: No cranial nerve deficit.     Comments: Doesn't communicate, has blank stare    LABORATORY PANEL:  Female CBC Recent Labs  Lab 11/22/19 0522  WBC 15.2*  HGB 11.3*  HCT 35.7*  PLT 168    ------------------------------------------------------------------------------------------------------------------ Chemistries  Recent Labs  Lab 11/21/19 0518 11/22/19 0522  NA 145 144  K 3.8 3.5  CL 110 112*  CO2 22 20*  GLUCOSE 95 97  BUN 41* 35*  CREATININE 1.64* 1.34*  CALCIUM 8.5* 8.4*  MG 1.5*  --   AST 50* 50*  ALT 15 18  ALKPHOS 32* 30*  BILITOT 0.9 1.1   RADIOLOGY:  No results found. ASSESSMENT AND PLAN:  85 y.o. female with medical history significant of dementia, HTN, CKD stage III, hypothyroidism, HLD, Anemia, Dysphagia, SVT, h/o of syncope, COPD, Benign essential tremor, Thrombocytopenia  Admitted for Sepsis, syncope  Present on Admission: Acute metabolic encephalopathy -  multifactorial - MRI of the brain neg for acute patho  - seems close to baseline now.   . Sepsis ([place]) - present on admission due to e. Coli UTI, treated. Afebrile. . Dehydration - improved with IVFs * Rhabdomyolysis: CK 1183 -> 882. Recheck in am . Syncope -  carotid dopplers showing no significant stenosis . Acute lower UTI -  treated with rocephin, urine c/s grew e.coli . Cardiomegaly - echo normal LV function * Nonsustained V-Tach - Keep K close to 4 & Mg above 2.  Elevated trop -due to demand ischemia  Family would like to keep her comfortable and avoid  hospitalization.  Palliative care d/w son and plan is to get her to SNF with Hospice once bed available. TOC team aware and working with [facility] team.   All the records are reviewed and case discussed with Care Management/Social Worker. Management plans discussed with the patient, palliative care, son at bedside and they are in agreement.  CODE STATUS: DNR, comfort care  TOTAL TIME TAKING CARE OF THIS PATIENT: 35 minutes.   More than 50% of the time was spent in counseling/coordination of care: YES  POSSIBLE D/C IN 1-2 DAYS, DEPENDING ON CLINICAL CONDITION.   Max Sane M.D on 11/23/2019 at 7:15 PM  Between 7am to 6pm -  Pager - [phone]  After 6pm go to [email or web address] - password [email or web address]  [facility]   CC: Primary care physician; System, Pcp Not In  Note: This dictation was prepared with Dragon dictation along with smaller phrase technology. Any transcriptional errors that result from this process are unintentional.

## 2019-11-23 NOTE — NC FL2 (Signed)
  [place] LEVEL OF CARE SCREENING TOOL     IDENTIFICATION  Patient Name: Dana Hudson Birthdate: 03-28-1924 Sex: female Admission Date (Current Location): 11/20/2019  [place] and [place] Number:  Engineering geologist and Address:  [facility], [address] [place], [place] [address]      Provider Number: [id number]  Attending Physician Name and Address:  Max Sane, MD  Relative Name and Phone Number:       Current Level of Care: Hospital Recommended Level of Care: Skilled Nursing Facility(with hospice services.) Prior Approval Number:    Date Approved/Denied:   PASRR Number: [id number]  Discharge Plan: SNF(with hospice services.)    Current Diagnoses: Patient Active Problem List   Diagnosis Date Noted  . Palliative care by specialist   . DNR (do not resuscitate)   . Confusion   . Sepsis ([place]) 11/20/2019  . Dehydration 11/20/2019  . Syncope 11/20/2019  . Urinary tract infection in elderly patient 11/20/2019  . Cardiomegaly 11/20/2019  . Acute metabolic encephalopathy [id number]  . Elevated troponin 11/20/2019  . Rhabdomyolysis 11/20/2019  . SVT/ PSVT/ PAT 05/02/2009  . EDEMA 05/02/2009  . PALPITATIONS 05/02/2009    Orientation RESPIRATION BLADDER Height & Weight     Self  Normal Incontinent, External catheter Weight: 156 lb 12.7 oz (71.1 kg) Height:  5\' 5"  (165.1 cm)  BEHAVIORAL SYMPTOMS/MOOD NEUROLOGICAL BOWEL NUTRITION STATUS  (None) (None) Continent Diet(DYS 3. Please send MINCED meats w/ Gravy; Yogurt TID meals. Cream Soups and puddings.)  AMBULATORY STATUS COMMUNICATION OF NEEDS Skin   Extensive Assist Verbally Bruising, Other (Comment)(Skin tear.)                       Personal Care Assistance Level of Assistance  Bathing, Feeding, Dressing Bathing Assistance: Maximum assistance Feeding assistance: Limited assistance Dressing Assistance: Maximum assistance     Functional Limitations Info   Sight, Hearing, Speech Sight Info: Adequate Hearing Info: Adequate Speech Info: Adequate    SPECIAL CARE FACTORS FREQUENCY                       Contractures Contractures Info: Not present    Additional Factors Info  Code Status, Allergies Code Status Info: DNR Allergies Info: NKDA           Current Medications (11/23/2019):  This is the current hospital active medication list Current Facility-Administered Medications  Medication Dose Route Frequency Provider Last Rate Last Admin  . acetaminophen (TYLENOL) tablet 650 mg  650 mg Oral Q6H PRN Toy Baker, MD       Or  . acetaminophen (TYLENOL) suppository 650 mg  650 mg Rectal Q6H PRN Doutova, Anastassia, MD      . LORazepam (ATIVAN) tablet 1 mg  1 mg Oral Q6H PRN Knox Royalty, NP      . morphine CONCENTRATE 10 MG/0.5ML oral solution 5 mg  5 mg Oral Q2H PRN Knox Royalty, NP      . ondansetron (ZOFRAN) tablet 4 mg  4 mg Oral Q6H PRN Doutova, Anastassia, MD       Or  . ondansetron (ZOFRAN) injection 4 mg  4 mg Intravenous Q6H PRN Toy Baker, MD         Discharge Medications: Please see discharge summary for a list of discharge medications.  Relevant Imaging Results:  Relevant Lab Results:   Additional Information SS#: [id number]  Candie Chroman, LCSW

## 2019-11-23 NOTE — Progress Notes (Signed)
Physical Therapy Treatment Patient Details Name: Dana Hudson MRN: [id number] DOB: 10-11-1924 Today's Date: 11/23/2019    History of Present Illness Dana Hudson is a 85 y.o. female admitted after family found her on the floor for unknown period of time, blood everywhere and pt had urinated on herself, neighbors noted that she has been having auditory and visual hallucinations and foul smelling urine with medical history significant of dementia, HTN, CKD stage III, hypothyroidism, HLD, Anemia, Dysphagia, SVT, h/o of syncope, COPD, Benign essential tremor, Thrombocytopenia    PT Comments    Pt did surprisingly well with PT today given her limitations yesterday.  She remains confused, but was pleasant and eager to participate t/o the session.  Pt needed only light assist to transition to sitting EOB and only light assist to get to standing.  She was able to ambulate ~35 ft X 2 with seated rest break between bouts and participated well (with some extra time for cuing and explanation) with exercises showing functional strength.      Follow Up Recommendations  SNF;Supervision/Assistance - 24 hour     Equipment Recommendations  Other (comment)(TBD at next venue of care)    Recommendations for Other Services       Precautions / Restrictions Precautions Precautions: Fall Restrictions Weight Bearing Restrictions: No    Mobility  Bed Mobility Overal bed mobility: Needs Assistance Bed Mobility: Supine to Sit;Sit to Supine     Supine to sit: Min assist Sit to supine: Min guard   General bed mobility comments: Pt was able to roll toward EOB and with only light single UE assist was able to rise to sitting and maintain sitting balance confidently  Transfers Overall transfer level: Needs assistance Equipment used: Rolling walker (2 wheeled) Transfers: Sit to/from Stand Sit to Stand: Min assist;Min guard         General transfer comment: first attempt at standing she did need light  assist to maintain forward/upright momentum.  After walk and brief sitting rest break she was able to rise again w/o direct assist and minimal cuing for UE use  Ambulation/Gait Ambulation/Gait assistance: Min guard Gait Distance (Feet): 35 Feet Assistive device: Rolling walker (2 wheeled)       General Gait Details: 35 ft X 2 (with brief seated rest break)  Pt was able to ambulate surprisingly well with appropriate walker usage.  She needed directional cuing and constant light encouragement but had no overt safety issues and vitals remained relatively stable and appropriate during both bouts.    Stairs             Wheelchair Mobility    Modified Rankin (Stroke Patients Only)       Balance Overall balance assessment: Needs assistance Sitting-balance support: Feet supported;No upper extremity supported Sitting balance-Leahy Scale: Fair     Standing balance support: Bilateral upper extremity supported Standing balance-Leahy Scale: Fair Standing balance comment: Pt with no overt LOBs, showed some cognitive safety concerns but minimal purely balance concerns with walker                            Cognition Arousal/Alertness: Awake/alert Behavior During Therapy: Restless Overall Cognitive Status: History of cognitive impairments - at baseline                                        Exercises  General Exercises - Lower Extremity Ankle Circles/Pumps: Strengthening;Both;10 reps Heel Slides: AROM;Strengthening;Both;10 reps(resisted leg extensions) Hip ABduction/ADduction: Strengthening;AROM;Both;10 reps Straight Leg Raises: AAROM;Both;5 reps    General Comments        Pertinent Vitals/Pain Pain Assessment: No/denies pain    Home Living                      Prior Function            PT Goals (current goals can now be found in the care plan section) Progress towards PT goals: Progressing toward goals    Frequency    Min  2X/week      PT Plan Current plan remains appropriate    Co-evaluation              AM-PAC PT "6 Clicks" Mobility   Outcome Measure  Help needed turning from your back to your side while in a flat bed without using bedrails?: A Little Help needed moving from lying on your back to sitting on the side of a flat bed without using bedrails?: A Little Help needed moving to and from a bed to a chair (including a wheelchair)?: A Little Help needed standing up from a chair using your arms (e.g., wheelchair or bedside chair)?: A Little Help needed to walk in hospital room?: A Little Help needed climbing 3-5 steps with a railing? : A Lot 6 Click Score: 17    End of Session Equipment Utilized During Treatment: Gait belt Activity Tolerance: Patient tolerated treatment well Patient left: in bed;with call bell/phone within reach;with bed alarm set Nurse Communication: Mobility status PT Visit Diagnosis: Muscle weakness (generalized) (M62.81);Difficulty in walking, not elsewhere classified (R26.2)     Time: [id number] PT Time Calculation (min) (ACUTE ONLY): 28 min  Charges:  $Gait Training: 8-22 mins $Therapeutic Exercise: 8-22 mins                     Kreg Shropshire, DPT 11/23/2019, 5:48 PM

## 2019-11-23 NOTE — TOC Initial Note (Addendum)
Transition of Care [facility]) - Initial/Assessment Note    Patient Details  Name: Dana Hudson MRN: [id number] Date of Birth: [birth year]  Transition of Care [facility]) CM/SW Contact:    Candie Chroman, LCSW Phone Number: 11/23/2019, 1:31 PM  Clinical Narrative: CSW received call back from [facility] social worker. She asked that CSW send referral to [facility] and [facility] for them to review. [facility] social worker will contact patient's family to discuss. Patient will not have any out of pocket costs since [facility] is agreeable to SNF with hospice services plan.      2:17 pm: Per palliative NP, family now wants to see if patient able to participate in therapy due to status improvement today. Sent message to PT assigned to let her know.             Expected Discharge Plan: Skilled Nursing Facility(with hospice.) Barriers to Discharge: SNF Pending bed offer   Patient Goals and CMS Choice Patient states their goals for this hospitalization and ongoing recovery are:: Patient not fully oriented.      Expected Discharge Plan and Services Expected Discharge Plan: Skilled Nursing Facility(with hospice.)     Post Acute Care Choice: Skilled Nursing Facility(with hospice services.) Living arrangements for the past 2 months: Apartment                                      Prior Living Arrangements/Services Living arrangements for the past 2 months: Apartment Lives with:: Self Patient language and need for interpreter reviewed:: Yes        Need for Family Participation in Patient Care: Yes (Comment) Care giver support system in place?: No (comment)   Criminal Activity/Legal Involvement Pertinent to Current Situation/Hospitalization: No - Comment as needed  Activities of Daily Living Home Assistive Devices/Equipment: Cane (specify quad or straight), Walker (specify type) ADL Screening (condition at time of admission) Patient's cognitive ability adequate to safely complete daily  activities?: No Is the patient deaf or have difficulty hearing?: Yes Does the patient have difficulty seeing, even when wearing glasses/contacts?: No Does the patient have difficulty concentrating, remembering, or making decisions?: Yes Patient able to express need for assistance with ADLs?: Yes Does the patient have difficulty dressing or bathing?: Yes Independently performs ADLs?: No Communication: Independent Dressing (OT): Needs assistance Is this a change from baseline?: Pre-admission baseline Grooming: Needs assistance Is this a change from baseline?: Pre-admission baseline Feeding: Independent Bathing: Needs assistance Is this a change from baseline?: Pre-admission baseline Toileting: Needs assistance Is this a change from baseline?: Pre-admission baseline In/Out Bed: Needs assistance Is this a change from baseline?: Pre-admission baseline Walks in Home: Needs assistance Is this a change from baseline?: Pre-admission baseline Does the patient have difficulty walking or climbing stairs?: Yes Weakness of Legs: Both Weakness of Arms/Hands: Both  Permission Sought/Granted                  Emotional Assessment Appearance:: Appears stated age Attitude/Demeanor/Rapport: Unable to Assess Affect (typically observed): Unable to Assess Orientation: : Oriented to Self Alcohol / Substance Use: Not Applicable Psych Involvement: No (comment)  Admission diagnosis:  Confusion [R41.0] Sepsis ([place]) [A41.9] Urinary tract infection in elderly patient [N39.0] Rhabdomyolysis [M62.82] Patient Active Problem List   Diagnosis Date Noted  . Palliative care by specialist   . DNR (do not resuscitate)   . Confusion   . Sepsis ([place]) 11/20/2019  . Dehydration  11/20/2019  . Syncope 11/20/2019  . Urinary tract infection in elderly patient 11/20/2019  . Cardiomegaly 11/20/2019  . Acute metabolic encephalopathy [id number]  . Elevated troponin 11/20/2019  . Rhabdomyolysis 11/20/2019  .  SVT/ PSVT/ PAT 05/02/2009  . EDEMA 05/02/2009  . PALPITATIONS 05/02/2009   PCP:  System, Pcp Not In Pharmacy:   [facility], [place] [address] Phone: [phone] Fax: [phone]     Social Determinants of Health (SDOH) Interventions    Readmission Risk Interventions No flowsheet data found.

## 2019-11-24 LAB — BASIC METABOLIC PANEL
Anion gap: 13 (ref 5–15)
BUN: 44 mg/dL — ABNORMAL HIGH (ref 8–23)
CO2: 20 mmol/L — ABNORMAL LOW (ref 22–32)
Calcium: 8.4 mg/dL — ABNORMAL LOW (ref 8.9–10.3)
Chloride: 108 mmol/L (ref 98–111)
Creatinine, Ser: 1.34 mg/dL — ABNORMAL HIGH (ref 0.44–1.00)
GFR calc Af Amer: 39 mL/min — ABNORMAL LOW (ref >60)
GFR calc non Af Amer: 34 mL/min — ABNORMAL LOW (ref >60)
Glucose, Bld: 130 mg/dL — ABNORMAL HIGH (ref 70–99)
Potassium: 3.1 mmol/L — ABNORMAL LOW (ref 3.5–5.1)
Sodium: 141 mmol/L (ref 135–145)

## 2019-11-24 LAB — CBC
HCT: 33.9 % — ABNORMAL LOW (ref 36.0–46.0)
Hemoglobin: 10.7 g/dL — ABNORMAL LOW (ref 12.0–15.0)
MCH: 30.1 pg (ref 26.0–34.0)
MCHC: 31.6 g/dL (ref 30.0–36.0)
MCV: 95.5 fL (ref 80.0–100.0)
Platelets: 157 10*3/uL (ref 150–400)
RBC: 3.55 MIL/uL — ABNORMAL LOW (ref 3.87–5.11)
RDW: 13.2 % (ref 11.5–15.5)
WBC: 7.6 10*3/uL (ref 4.0–10.5)
nRBC: 0 % (ref 0.0–0.2)

## 2019-11-24 LAB — RESPIRATORY PANEL BY RT PCR (FLU A&B, COVID)
Influenza A by PCR: NEGATIVE
Influenza B by PCR: NEGATIVE
SARS Coronavirus 2 by RT PCR: NEGATIVE

## 2019-11-24 MED ORDER — FERROUS SULFATE 325 (65 FE) MG PO TABS: 325.0000 mg | ORAL_TABLET | Freq: Every day | ORAL | Status: DC

## 2019-11-24 MED ORDER — IRBESARTAN 150 MG PO TABS
75.0000 mg | ORAL_TABLET | Freq: Every day | ORAL | Status: DC
  Administered 2019-11-24: 75 mg via ORAL
  Filled 2019-11-24: qty 1

## 2019-11-24 MED ORDER — CALCIUM CARBONATE 1250 (500 CA) MG PO TABS
500.0000 mg | ORAL_TABLET | Freq: Every day | ORAL | Status: DC
  Filled 2019-11-24: qty 1

## 2019-11-24 MED ORDER — LEVOTHYROXINE SODIUM 88 MCG PO TABS: 88.0000 ug | ORAL_TABLET | Freq: Every day | ORAL | Status: DC

## 2019-11-24 MED ORDER — CEPHALEXIN 500 MG PO CAPS
500.0000 mg | ORAL_CAPSULE | Freq: Two times a day (BID) | ORAL | Status: DC
  Administered 2019-11-24: 12:00:00 500 mg via ORAL
  Filled 2019-11-24: qty 1

## 2019-11-24 MED ORDER — PANTOPRAZOLE SODIUM 40 MG PO TBEC
40.0000 mg | DELAYED_RELEASE_TABLET | Freq: Every day | ORAL | Status: DC
  Administered 2019-11-24: 12:00:00 40 mg via ORAL
  Filled 2019-11-24: qty 1

## 2019-11-24 MED ORDER — POTASSIUM CHLORIDE CRYS ER 20 MEQ PO TBCR
40.0000 meq | EXTENDED_RELEASE_TABLET | Freq: Once | ORAL | Status: AC
  Administered 2019-11-24: 40 meq via ORAL
  Filled 2019-11-24: qty 2

## 2019-11-24 MED ORDER — HYDROCHLOROTHIAZIDE 12.5 MG PO CAPS
12.5000 mg | ORAL_CAPSULE | Freq: Every day | ORAL | Status: DC
  Administered 2019-11-24: 12:00:00 12.5 mg via ORAL
  Filled 2019-11-24: qty 1

## 2019-11-24 MED ORDER — CEPHALEXIN 500 MG PO CAPS: 500.0000 mg | ORAL_CAPSULE | Freq: Two times a day (BID) | ORAL | 0 refills | Status: AC

## 2019-11-24 MED ORDER — CALCIUM CARBONATE 1500 (600 CA) MG PO TABS: 600.0000 mg | ORAL_TABLET | Freq: Every day | ORAL | Status: DC

## 2019-11-24 MED ORDER — VALSARTAN-HYDROCHLOROTHIAZIDE 80-12.5 MG PO TABS: 1.0000 | ORAL_TABLET | Freq: Every day | ORAL | Status: DC

## 2019-11-24 NOTE — Discharge Instructions (Signed)
Patient will follow-up with the [facility] after discharge from rehab

## 2019-11-24 NOTE — NC FL2 (Signed)
[place] LEVEL OF CARE SCREENING TOOL     IDENTIFICATION  Patient Name: Dana Hudson Birthdate: 1924/04/28 Sex: female Admission Date (Current Location): 11/20/2019  [place] and [place] Number:  Engineering geologist and Address:  [facility], [address] [place], [place] [address]      Provider Number: [id number]  Attending Physician Name and Address:  Fritzi Mandes, MD  Relative Name and Phone Number:       Current Level of Care: Hospital Recommended Level of Care: Skilled Nursing Facility(with palliative) Prior Approval Number:    Date Approved/Denied:   PASRR Number: [id number]  Discharge Plan: SNF(with palliative.)    Current Diagnoses: Patient Active Problem List   Diagnosis Date Noted  . Adult failure to thrive   . Weakness generalized   . Palliative care by specialist   . DNR (do not resuscitate)   . Confusion   . Sepsis ([place]) 11/20/2019  . Dehydration 11/20/2019  . Syncope 11/20/2019  . Urinary tract infection in elderly patient 11/20/2019  . Cardiomegaly 11/20/2019  . Acute metabolic encephalopathy [id number]  . Elevated troponin 11/20/2019  . Rhabdomyolysis 11/20/2019  . SVT/ PSVT/ PAT 05/02/2009  . EDEMA 05/02/2009  . PALPITATIONS 05/02/2009    Orientation RESPIRATION BLADDER Height & Weight     Self  Normal Incontinent, External catheter Weight: 154 lb 6.4 oz (70 kg) Height:  5\' 5"  (165.1 cm)  BEHAVIORAL SYMPTOMS/MOOD NEUROLOGICAL BOWEL NUTRITION STATUS  (None) (None) Continent Diet(DYS 3. Please send MINCED meats w/ Gravy; Yogurt TID meals. Cream Soups and puddings.)  AMBULATORY STATUS COMMUNICATION OF NEEDS Skin   Limited Assist Verbally Bruising, Other (Comment)(Skin tear.)                       Personal Care Assistance Level of Assistance  Bathing, Feeding, Dressing Bathing Assistance: Limited assistance Feeding assistance: Limited assistance Dressing Assistance: Limited assistance      Functional Limitations Info  Sight, Hearing, Speech Sight Info: Adequate Hearing Info: Adequate Speech Info: Adequate    SPECIAL CARE FACTORS FREQUENCY  PT (By licensed PT), OT (By licensed OT), Speech therapy     PT Frequency: 5 x week OT Frequency: 5 x week     Speech Therapy Frequency: 5 x week      Contractures Contractures Info: Not present    Additional Factors Info  Code Status, Allergies Code Status Info: DNR Allergies Info: NKDA           Current Medications (11/24/2019):  This is the current hospital active medication list Current Facility-Administered Medications  Medication Dose Route Frequency Provider Last Rate Last Admin  . acetaminophen (TYLENOL) tablet 650 mg  650 mg Oral Q6H PRN Toy Baker, MD       Or  . acetaminophen (TYLENOL) suppository 650 mg  650 mg Rectal Q6H PRN Doutova, Anastassia, MD      . calcium carbonate (OS-CAL - dosed in mg of elemental calcium) tablet 500 mg of elemental calcium  500 mg of elemental calcium Oral Q breakfast Fritzi Mandes, MD      . cephALEXin (KEFLEX) capsule 500 mg  500 mg Oral Q12H Fritzi Mandes, MD   500 mg at 11/24/19 1144  . [START ON 11/25/2019] ferrous sulfate tablet 325 mg  325 mg Oral Q breakfast Fritzi Mandes, MD      . irbesartan (AVAPRO) tablet 75 mg  75 mg Oral Daily Fritzi Mandes, MD   75 mg at 11/24/19 1144  And  . hydrochlorothiazide (MICROZIDE) capsule 12.5 mg  12.5 mg Oral Daily Fritzi Mandes, MD   12.5 mg at 11/24/19 1144  . [START ON 11/25/2019] levothyroxine (SYNTHROID) tablet 88 mcg  88 mcg Oral QAC breakfast Fritzi Mandes, MD      . LORazepam (ATIVAN) tablet 1 mg  1 mg Oral Q6H PRN Knox Royalty, NP      . ondansetron [facility]) tablet 4 mg  4 mg Oral Q6H PRN Toy Baker, MD       Or  . ondansetron (ZOFRAN) injection 4 mg  4 mg Intravenous Q6H PRN Doutova, Anastassia, MD      . pantoprazole (PROTONIX) EC tablet 40 mg  40 mg Oral Daily Fritzi Mandes, MD   40 mg at 11/24/19 1145      Discharge Medications: Please see discharge summary for a list of discharge medications.  Relevant Imaging Results:  Relevant Lab Results:   Additional Information SS#: [id number]  Candie Chroman, LCSW

## 2019-11-24 NOTE — TOC Transition Note (Addendum)
Transition of Care [facility]) - CM/SW Discharge Note   Patient Details  Name: Dana Hudson MRN: [id number] Date of Birth: 05-25-1924  Transition of Care [facility]) CM/SW Contact:  Candie Chroman, LCSW Phone Number: 11/24/2019, 2:18 PM   Clinical Narrative: Patient has orders to discharge to [facility] today. RN will call report to [phone] (Room 605). [facility] will pick her up in 20-30 minutes. No further concerns. CSW signing off.    Final next level of care: Skilled Nursing Facility Barriers to Discharge: Barriers Resolved   Patient Goals and CMS Choice Patient states their goals for this hospitalization and ongoing recovery are:: Patient not fully oriented.   Choice offered to / list presented to : Adult Children  Discharge Placement   Existing PASRR number confirmed : 11/23/19          Patient chooses bed at: [name] Patient to be transferred to facility by: [facility] Name of family member notified: Zulma Langager Patient and family notified of of transfer: 11/24/19  Discharge Plan and Services     Post Acute Care Choice: Skilled Nursing Facility(with hospice services.)                               Social Determinants of Health (SDOH) Interventions     Readmission Risk Interventions No flowsheet data found.

## 2019-11-24 NOTE — Care Management Important Message (Signed)
Important Message  Patient Details  Name: Dana Hudson MRN: [id number] Date of Birth: [birth year]   Medicare Important Message Given:  Yes  Initial Medicare IM given by Patient Access Associate on 11/23/2019 at 1:51pm.     Dannette Barbara 11/24/2019, 8:29 AM

## 2019-11-24 NOTE — TOC Progression Note (Addendum)
Transition of Care [facility]) - Progression Note    Patient Details  Name: Dana Hudson MRN: [id number] Date of Birth: 05/17/1924  Transition of Care [facility]) CM/SW [place], LCSW Phone Number: 11/24/2019, 9:14 AM  Clinical Narrative: CSW called patient's son Marlou Sa to provide update and let him know how patient did in PT yesterday afternoon. Asked if he was wanting rehab or hospice services at the SNF. He is unsure and wants [facility] to review the notes and see what their recommendations are. [facility] and they can take her once level of care is determined. MD will order a new COVID test.  11:04 am: Plan for SNF with rehab. COVID test pending.  1:29 pm: COVID results are negative. [facility] has notified [facility] and they will call them back once they know the time patient will be picked up.  Expected Discharge Plan: Skilled Nursing Facility(with hospice.) Barriers to Discharge: SNF Pending bed offer  Expected Discharge Plan and Services Expected Discharge Plan: Skilled Nursing Facility(with hospice.)     Post Acute Care Choice: Skilled Nursing Facility(with hospice services.) Living arrangements for the past 2 months: Apartment                                       Social Determinants of Health (SDOH) Interventions    Readmission Risk Interventions No flowsheet data found.

## 2019-11-24 NOTE — Discharge Summary (Addendum)
[facility] NAME: [place] Hudson    MR#:  [id number]  DATE OF BIRTH:  1924-09-13  DATE OF ADMISSION:  11/20/2019 ADMITTING PHYSICIAN: Max Sane, MD  DATE OF DISCHARGE: 11/24/2019  PRIMARY CARE PHYSICIAN: System, Pcp Not In    ADMISSION DIAGNOSIS:  Confusion [R41.0] Sepsis ([place]) [A41.9] Urinary tract infection in elderly patient [N39.0] Rhabdomyolysis [M62.82]  DISCHARGE DIAGNOSIS:  sepsis present on admission resolved due to E. coli UTI acute metabolic encephalopathy-- improved acute mild rhabdomyolysis dehydration SECONDARY DIAGNOSIS:   Past Medical History:  Diagnosis Date  . Anemia   . Arthritis   . Cancer ([place])    SKIN  . Chronic kidney disease    STAGE 3  . Dementia ([place])   . Dementia ([place])   . Dysrhythmia   . Edema   . GERD (gastroesophageal reflux disease)   . HOH (hard of hearing)    AIDS  . Hypothyroidism   . Palpitations   . Parkinson disease ([place])   . Paroxysmal supraventricular tachycardia (HCC)    SVT/PSVT/PAT    HOSPITAL COURSE:   85 y.o.femalewith medical history significant of dementia, HTN, CKD stage III, hypothyroidism, HLD, Anemia, Dysphagia, SVT, h/o of syncope, COPD,Benign essential tremor,ThrombocytopeniaAdmitted for Sepsis, syncope  Acute metabolic encephalopathy-- most likely multifactorial secondary to combination ofinfection dehydration secondary to decreased by mouth intake  - received IVFs-- tolerating PO diet - treat underlining infection  - MRI of the brain no acute pathology - neurological exam appears to be nonfocal but patient is alert and. She is near baseline.  .Sepsis(HCC) -present on admission due to UTI with acute renal failure and encephaloapthy- now  improved - continue IV rocephin -- change to Keflex three more days -urine culture shows E. coli pain sensitive. Patient afebrile while stable  .Dehydration/AKI-  received IVFs  *acute mild  rhabdomyolysis: present on admission - monitor CK, continue IVFs -labs show improvement after hydration  .Syncope -carotid dopplers showing no significant stenosis, echo  50 to 55%. No valvular abnormality  .Acute lower UTI -- continue Rocephin -- change to PO Keflex urine culture growing E. coli  * Nonsustained V-Tach - all day, also some ventricular bigeminy She had hx of A.fib per family and had ablation per epic it was SVT Pt is at high risk for falls would not be candidate for anticoagulation -- seen by cardiology. Patient's heart rate now is in the 50s.  Patient was seen by palliative care. She overall improved initially was on full comfort care measures how were she did work with physical therapy. I spoke with Dr. Lutricia Feil with [facility] and given the patient showed improvement [facility] and thereafter will be followed by case to determine further needs depending on how patient is doing. Home meds have been resumed after discussing with Dr. Lutricia Feil.  Code status DNR prior to admission  CONSULTS OBTAINED:    DRUG ALLERGIES:  No Known Allergies  DISCHARGE MEDICATIONS:   Allergies as of 11/24/2019   No Known Allergies     Medication List    STOP taking these medications   CALTRATE 600 PO     TAKE these medications   Calcium Carbonate-Vitamin D 600-400 MG-UNIT tablet Take 1 tablet by mouth.   cephALEXin 500 MG capsule Commonly known as: KEFLEX Take 1 capsule (500 mg total) by mouth every 12 (twelve) hours for 3 days.   Diovan HCT 80-12.5 MG tablet Generic drug: valsartan-hydrochlorothiazide Take  1 tablet daily by mouth.   ferrous sulfate 325 (65 FE) MG tablet Take 325 mg by mouth daily with breakfast.   levothyroxine 88 MCG tablet Commonly known as: SYNTHROID Take 88 mcg daily before breakfast by mouth.   omeprazole 20 MG capsule Commonly known as: PRILOSEC Take 20 mg daily by mouth.       If you  experience worsening of your admission symptoms, develop shortness of breath, life threatening emergency, suicidal or homicidal thoughts you must seek medical attention immediately by calling 911 or calling your MD immediately  if symptoms less severe.  You Must read complete instructions/literature along with all the possible adverse reactions/side effects for all the Medicines you take and that have been prescribed to you. Take any new Medicines after you have completely understood and accept all the possible adverse reactions/side effects.   Please note  You were cared for by a hospitalist during your hospital stay. If you have any questions about your discharge medications or the care you received while you were in the hospital after you are discharged, you can call the unit and asked to speak with the hospitalist on call if the hospitalist that took care of you is not available. Once you are discharged, your primary care physician will handle any further medical issues. Please note that NO REFILLS for any discharge medications will be authorized once you are discharged, as it is imperative that you return to your primary care physician (or establish a relationship with a primary care physician if you do not have one) for your aftercare needs so that they can reassess your need for medications and monitor your lab values. Today   SUBJECTIVE   Sitting at the edge of the bed. Hard on hearing. No new issues per RN  VITAL SIGNS:  Blood pressure (!) 117/54, pulse (!) 55, temperature 98.6 F (37 C), temperature source Oral, resp. rate 18, height 5\' 5"  (1.651 m), weight 70 kg, SpO2 99 %.  I/O:    Intake/Output Summary (Last 24 hours) at 11/24/2019 1149 Last data filed at 11/23/2019 1829 Gross per 24 hour  Intake 680 ml  Output --  Net 680 ml    PHYSICAL EXAMINATION:  GENERAL:  85 y.o.-year-old patient lying in the bed with no acute distress.  EYES: Pupils equal, round, reactive to light  and accommodation. No scleral icterus. Extraocular muscles intact.  HEENT: Head atraumatic, normocephalic. Oropharynx and nasopharynx clear.  NECK:  Supple, no jugular venous distention. No thyroid enlargement, no tenderness.  LUNGS: Normal breath sounds bilaterally, no wheezing, rales,rhonchi or crepitation. No use of accessory muscles of respiration.  CARDIOVASCULAR: S1, S2 normal. No murmurs, rubs, or gallops.  ABDOMEN: Soft, non-tender, non-distended. Bowel sounds present. No organomegaly or mass.  EXTREMITIES: No pedal edema, cyanosis, or clubbing.  NEUROLOGIC: grossly nonfocal. PSYCHIATRIC: The patient is alert and awake SKIN: No obvious rash, lesion, or ulcer.   DATA REVIEW:   CBC  Recent Labs  Lab 11/22/19 0522  WBC 15.2*  HGB 11.3*  HCT 35.7*  PLT 168    Chemistries  Recent Labs  Lab 11/21/19 0518 11/22/19 0522  NA 145 144  K 3.8 3.5  CL 110 112*  CO2 22 20*  GLUCOSE 95 97  BUN 41* 35*  CREATININE 1.64* 1.34*  CALCIUM 8.5* 8.4*  MG 1.5*  --   AST 50* 50*  ALT 15 18  ALKPHOS 32* 30*  BILITOT 0.9 1.1    Microbiology Results   Recent Results (from  the past 240 hour(s))  Urine culture     Status: Abnormal   Collection Time: 11/20/19  7:36 PM   Specimen: Urine, Random  Result Value Ref Range Status   Specimen Description   Final    URINE, RANDOM Performed at [facility], [address] [place], [place] [address]    Special Requests   Final    NONE Performed at [facility], [place], [place] [address]    Culture >=100,000 COLONIES/mL ESCHERICHIA COLI (A)  Final   Report Status 11/23/2019 FINAL  Final   Organism ID, Bacteria ESCHERICHIA COLI (A)  Final      Susceptibility   Escherichia coli - MIC*    AMPICILLIN <=2 SENSITIVE Sensitive     CEFAZOLIN <=4 SENSITIVE Sensitive     CEFTRIAXONE <=1 SENSITIVE Sensitive     CIPROFLOXACIN <=0.25 SENSITIVE Sensitive     GENTAMICIN <=1 SENSITIVE Sensitive     IMIPENEM  <=0.25 SENSITIVE Sensitive     NITROFURANTOIN <=16 SENSITIVE Sensitive     TRIMETH/SULFA <=20 SENSITIVE Sensitive     AMPICILLIN/SULBACTAM <=2 SENSITIVE Sensitive     PIP/TAZO <=4 SENSITIVE Sensitive     * >=100,000 COLONIES/mL ESCHERICHIA COLI  SARS CORONAVIRUS 2 (TAT 6-24 HRS) Nasopharyngeal Nasopharyngeal Swab     Status: None   Collection Time: 11/20/19  9:03 PM   Specimen: Nasopharyngeal Swab  Result Value Ref Range Status   SARS Coronavirus 2 NEGATIVE NEGATIVE Final    Comment: (NOTE) SARS-CoV-2 target nucleic acids are NOT DETECTED. The SARS-CoV-2 RNA is generally detectable in upper and lower respiratory specimens during the acute phase of infection. Negative results do not preclude SARS-CoV-2 infection, do not rule out co-infections with other pathogens, and should not be used as the sole basis for treatment or other patient management decisions. Negative results must be combined with clinical observations, patient history, and epidemiological information. The expected result is Negative. Fact Sheet for Patients: SugarRoll.be Fact Sheet for Healthcare Providers: https://www.woods-mathews.com/ This test is not yet approved or cleared by the [place] FDA and  has been authorized for detection and/or diagnosis of SARS-CoV-2 by FDA under an Emergency Use Authorization (EUA). This EUA will remain  in effect (meaning this test can be used) for the duration of the COVID-19 declaration under Section 56 4(b)(1) of the Act, 21 U.S.C. section 360bbb-3(b)(1), unless the authorization is terminated or revoked sooner. Performed at [facility], [place] [address], [place], [place] [address]   Culture, blood (x 2)     Status: None (Preliminary result)   Collection Time: 11/20/19 10:18 PM   Specimen: BLOOD  Result Value Ref Range Status   Specimen Description BLOOD LEFT ANTECUBITAL  Final   Special Requests   Final    BOTTLES DRAWN  AEROBIC AND ANAEROBIC Blood Culture adequate volume   Culture   Final    NO GROWTH 3 DAYS Performed at [facility], [address], [place], [place] [address]    Report Status PENDING  Incomplete  Culture, blood (x 2)     Status: None (Preliminary result)   Collection Time: 11/20/19 10:18 PM   Specimen: BLOOD  Result Value Ref Range Status   Specimen Description BLOOD RIGHT ANTECUBITAL  Final   Special Requests   Final    BOTTLES DRAWN AEROBIC AND ANAEROBIC Blood Culture adequate volume   Culture   Final    NO GROWTH 3 DAYS Performed at [facility], [place],  [place], [place] [address]    Report Status PENDING  Incomplete    RADIOLOGY:  No results found.   CODE STATUS:     Code Status Orders  (From admission, onward)         Start     Ordered   11/20/19 2225  Do not attempt resuscitation (DNR)  Continuous    Question Answer Comment  In the event of cardiac or respiratory ARREST Do not call a "code blue"   In the event of cardiac or respiratory ARREST Do not perform Intubation, CPR, defibrillation or ACLS   In the event of cardiac or respiratory ARREST Use medication by any route, position, wound care, and other measures to relive pain and suffering. May use oxygen, suction and manual treatment of airway obstruction as needed for comfort.      11/20/19 2224        Code Status History    This patient has a current code status but no historical code status.   Advance Care Planning Activity    Advance Directive Documentation     Most Recent Value  Type of Advance Directive  Out of facility DNR (pink MOST or yellow form)  Pre-existing out of facility DNR order (yellow form or pink MOST form)  Pink MOST/Yellow Form most recent copy in chart - Physician notified to receive inpatient order  "MOST" Form in Place?  --       TOTAL TIME TAKING CARE OF THIS PATIENT: *40* minutes.    Fritzi Mandes M.D on 11/24/2019 at 11:49 AM  Between 7am to  6pm - Pager - [phone] After 6pm go to [email or web address] - password [email or web address]  [facility]    CC: Primary care physician; System, Pcp Not In

## 2019-11-24 NOTE — Progress Notes (Signed)
[facility] called to [facility] and given to a nurse named Maudie Mercury.  Pt and pt's son given discharge education and a copy of AVS. IV removed. Pt's vitals stable.

## 2019-11-25 LAB — CULTURE, BLOOD (ROUTINE X 2)
Culture: NO GROWTH
Culture: NO GROWTH
Special Requests: ADEQUATE
Special Requests: ADEQUATE

## 2020-08-05 IMAGING — CT CT HEAD W/O CM
4 series · 16 of 47 positions shown, 18 images · non-contrast
Comparison: Report only from PET-CT 01/14/2003. Whole-body bone
scan 04/08/2007.

CLINICAL DATA: Altered mental status. Possible fall. History of
dementia. No given history of malignancy.

EXAM:
CT HEAD WITHOUT CONTRAST
TECHNIQUE: Contiguous axial images were obtained from the base of the skull
through the vertex without intravenous contrast.

[Series 2: head bone · axial · 0.42mm/px · z∈[-170,-120]mm · 4 of 77 slices shown]
[im 8/77  bone]
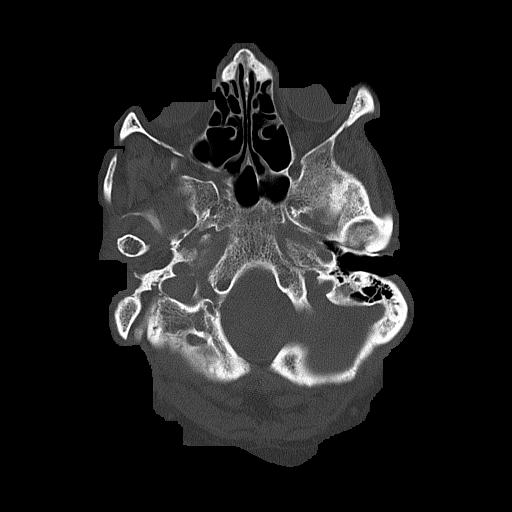
[im 15/77  bone]
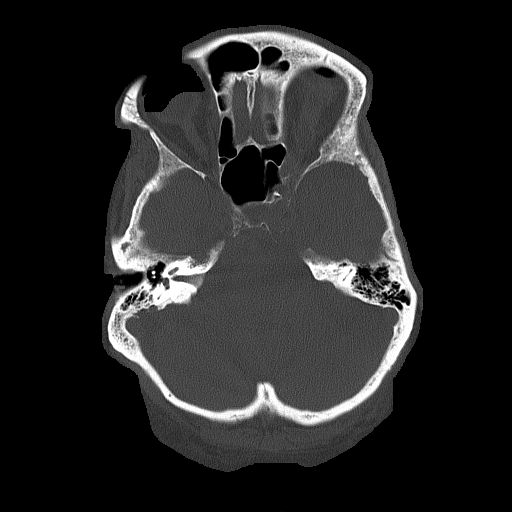
[im 26/77  bone]
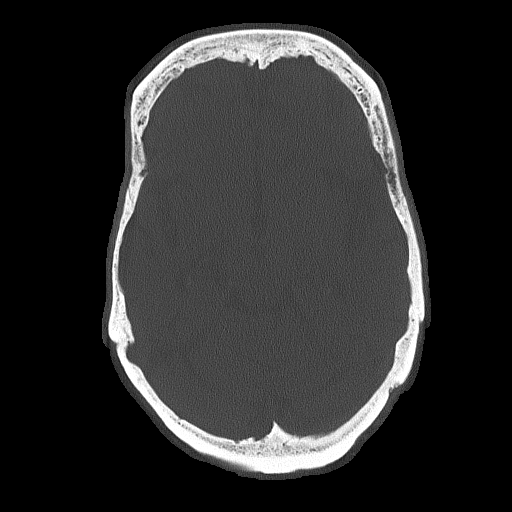
[im 33/77  bone]
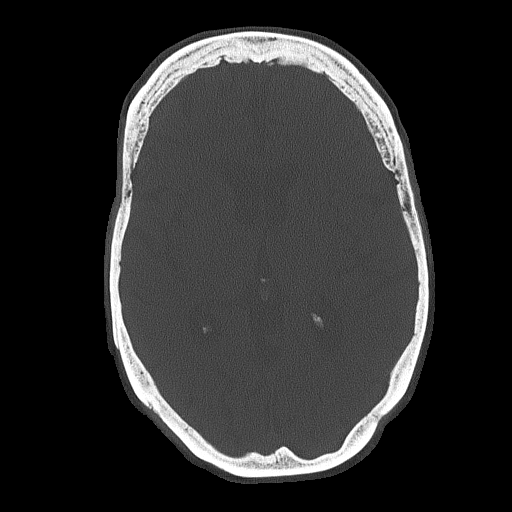

[Series 3: head wo · axial · 0.42mm/px · z∈[-164,-64]mm · 6 of 30 slices shown, 8 images]
[im 5/30  brain]
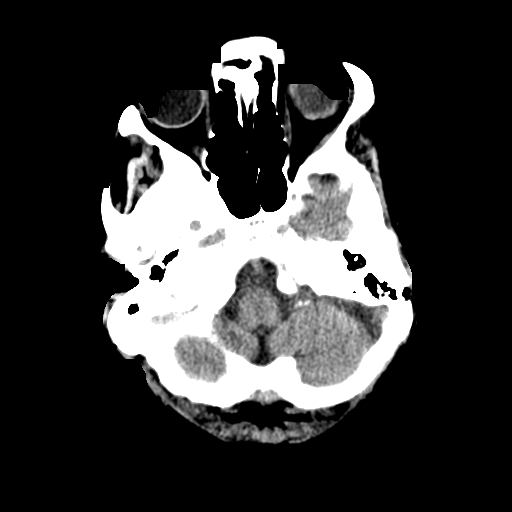
[im 5/30  bone]
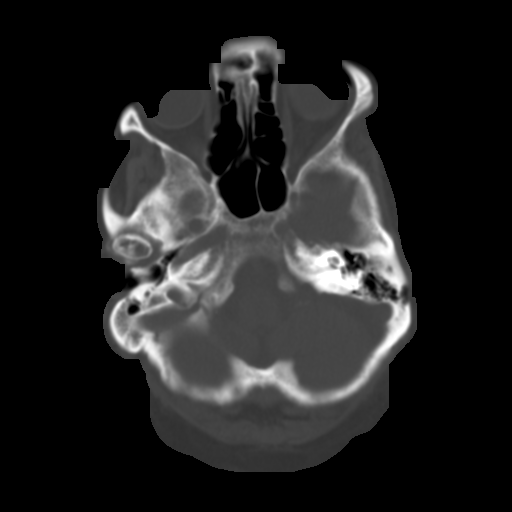
[im 9/30  brain]
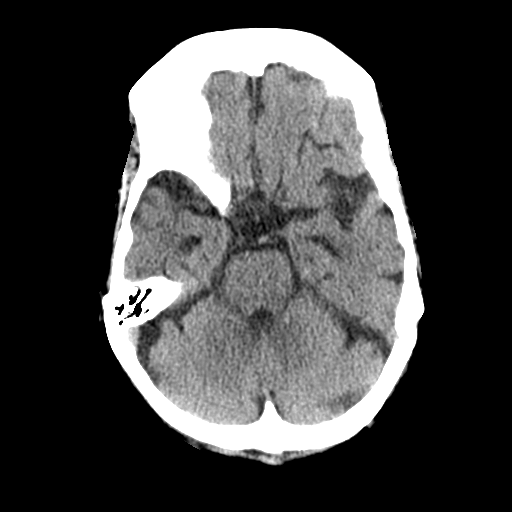
[im 13/30  brain]
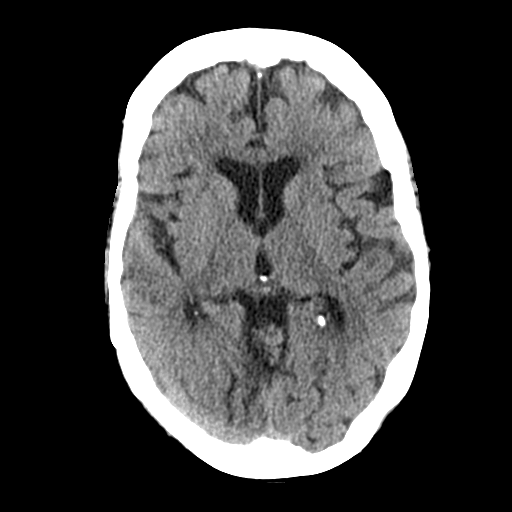
[im 17/30  brain]
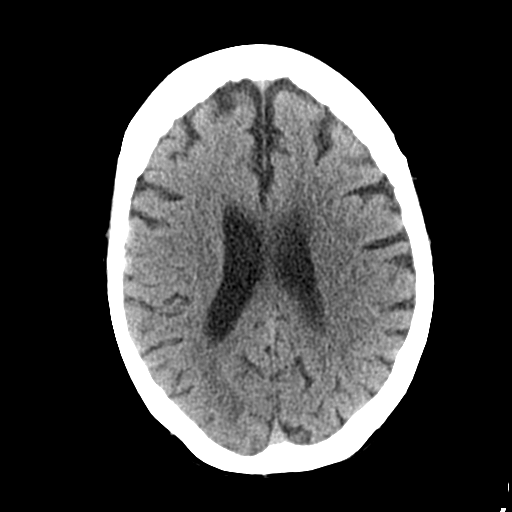
[im 21/30  brain]
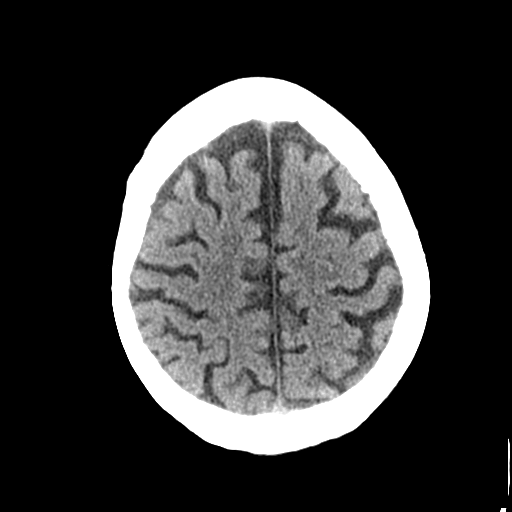
[im 21/30  bone]
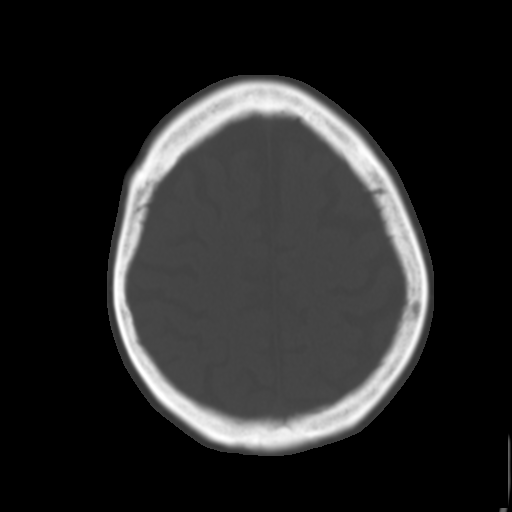
[im 25/30  brain]
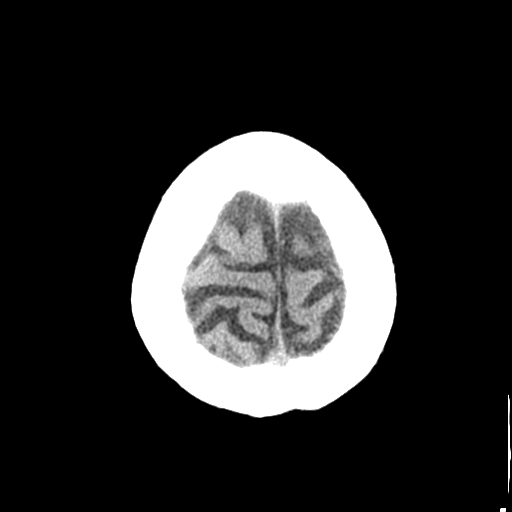

[Series 4: coronal soft tissue · coronal · 0.29mm/px · 3 of 63 slices shown]
[im 21/63  brain]
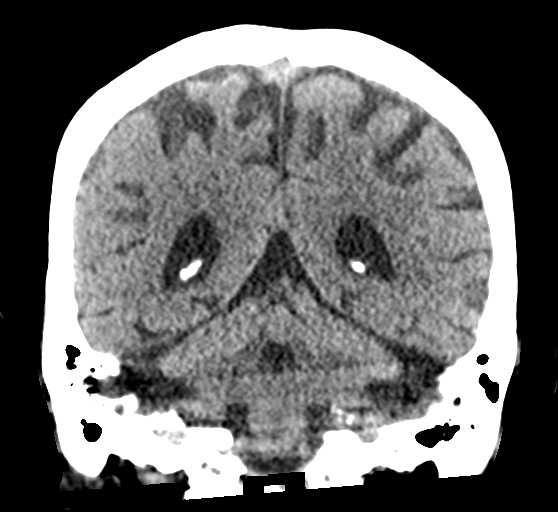
[im 28/63  brain]
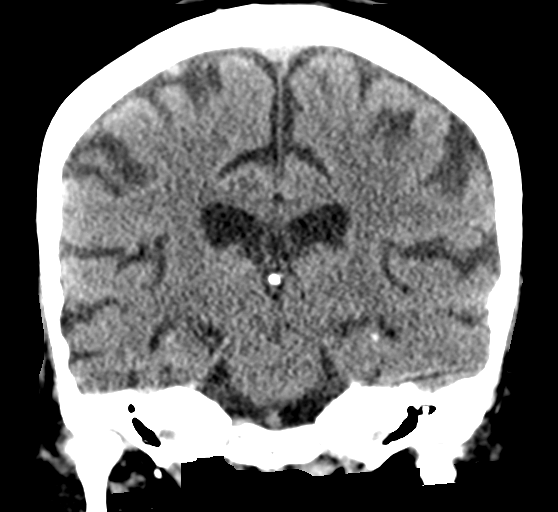
[im 35/63  brain]
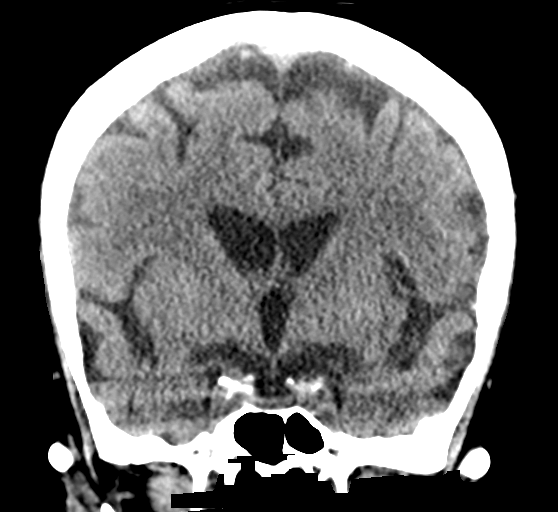

[Series 5: sagittal soft tissue · sagittal · 0.29mm/px · 3 of 46 slices shown]
[im 16/46  brain]
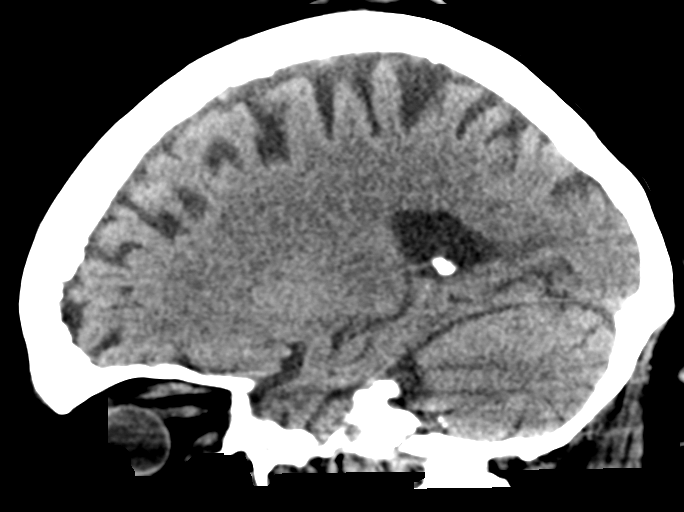
[im 23/46  brain]
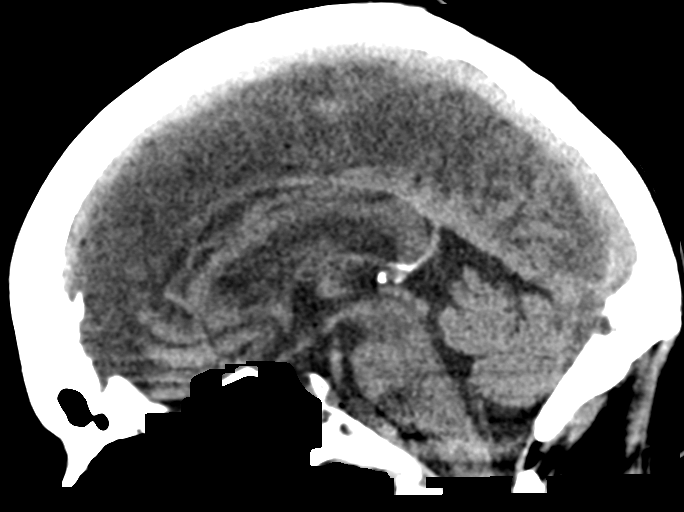
[im 31/46  brain]
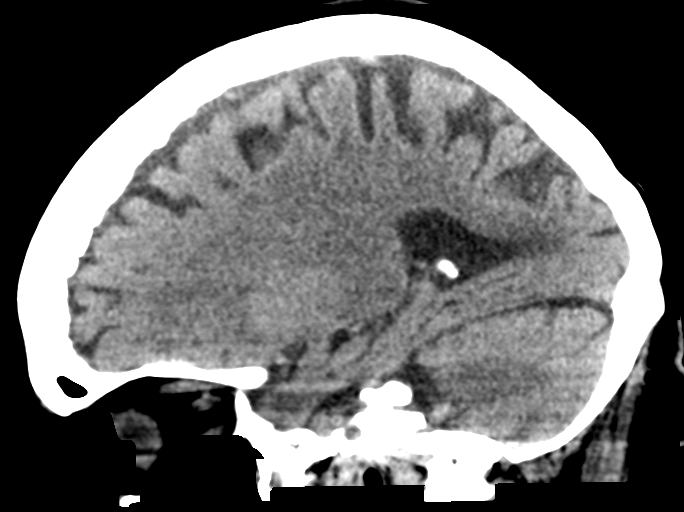

[16 of 47 positions shown; findings below may reference images not displayed]

FINDINGS: Brain: There is no evidence of acute intracranial hemorrhage, mass
lesion, brain edema or extra-axial fluid collection. There is
atrophy with prominence of the ventricles and subarachnoid spaces.
Minimal chronic small vessel ischemic changes in the periventricular
white matter. There is no CT evidence of acute cortical infarction.

Vascular: Mild intracranial vascular calcifications. No hyperdense
vessel identified.

Skull: There is a 1.7 cm lucent lesion in the left parietal bone on
image 59/2. This appears nonaggressive and may reflect a hemangioma.
No acute osseous findings.

Sinuses/Orbits: The visualized paranasal sinuses and mastoid air
cells are clear. Previous bilateral lens surgery.

Other: Right TMJ osteoarthritis.
IMPRESSION: 1. No acute intracranial or calvarial findings.
2. Lucent lesion in the left parietal bone, unlikely to be
clinically significant in this [AGE], and possibly a
hemangioma.

## 2020-11-25 DEATH — deceased
